# Patient Record
Sex: Female | Born: 1960 | Race: White | Hispanic: No | Marital: Married | Smoking: Never smoker
Health system: Southern US, Community
[De-identification: ages and names within clinical notes are randomized; demographics above are authoritative.]

## PROBLEM LIST (undated history)

## (undated) DIAGNOSIS — K219 Gastro-esophageal reflux disease without esophagitis: Secondary | ICD-10-CM

## (undated) DIAGNOSIS — I10 Essential (primary) hypertension: Secondary | ICD-10-CM

## (undated) DIAGNOSIS — D219 Benign neoplasm of connective and other soft tissue, unspecified: Secondary | ICD-10-CM

## (undated) DIAGNOSIS — N852 Hypertrophy of uterus: Secondary | ICD-10-CM

## (undated) DIAGNOSIS — N83209 Unspecified ovarian cyst, unspecified side: Secondary | ICD-10-CM

## (undated) DIAGNOSIS — F419 Anxiety disorder, unspecified: Secondary | ICD-10-CM

## (undated) DIAGNOSIS — G4700 Insomnia, unspecified: Secondary | ICD-10-CM

## (undated) DIAGNOSIS — R609 Edema, unspecified: Secondary | ICD-10-CM

## (undated) DIAGNOSIS — B029 Zoster without complications: Secondary | ICD-10-CM

## (undated) HISTORY — DX: Benign neoplasm of connective and other soft tissue, unspecified: D21.9

## (undated) HISTORY — DX: Insomnia, unspecified: G47.00

## (undated) HISTORY — PX: TUBAL LIGATION: SHX77

## (undated) HISTORY — DX: Unspecified ovarian cyst, unspecified side: N83.209

## (undated) HISTORY — DX: Zoster without complications: B02.9

## (undated) HISTORY — DX: Gastro-esophageal reflux disease without esophagitis: K21.9

## (undated) HISTORY — DX: Anxiety disorder, unspecified: F41.9

## (undated) HISTORY — DX: Essential (primary) hypertension: I10

## (undated) HISTORY — DX: Hypertrophy of uterus: N85.2

## (undated) HISTORY — DX: Edema, unspecified: R60.9

---

## 2005-02-08 ENCOUNTER — Ambulatory Visit: Payer: Self-pay | Admitting: Obstetrics and Gynecology

## 2005-12-06 ENCOUNTER — Ambulatory Visit: Payer: Self-pay | Admitting: Gastroenterology

## 2006-02-27 ENCOUNTER — Ambulatory Visit: Payer: Self-pay | Admitting: Obstetrics and Gynecology

## 2007-03-05 ENCOUNTER — Ambulatory Visit: Payer: Self-pay | Admitting: Obstetrics and Gynecology

## 2008-03-08 ENCOUNTER — Ambulatory Visit: Payer: Self-pay | Admitting: Obstetrics and Gynecology

## 2009-05-18 ENCOUNTER — Ambulatory Visit: Payer: Self-pay | Admitting: Obstetrics and Gynecology

## 2010-07-20 ENCOUNTER — Ambulatory Visit: Payer: Self-pay | Admitting: Obstetrics and Gynecology

## 2011-08-08 ENCOUNTER — Ambulatory Visit: Payer: Self-pay | Admitting: Obstetrics and Gynecology

## 2012-08-12 ENCOUNTER — Ambulatory Visit: Payer: Self-pay | Admitting: Obstetrics and Gynecology

## 2013-05-25 LAB — HM COLONOSCOPY

## 2013-06-11 DIAGNOSIS — Z1211 Encounter for screening for malignant neoplasm of colon: Secondary | ICD-10-CM | POA: Insufficient documentation

## 2013-06-25 LAB — HM PAP SMEAR: HM Pap smear: NEGATIVE

## 2013-09-29 ENCOUNTER — Ambulatory Visit: Payer: Self-pay | Admitting: Obstetrics and Gynecology

## 2014-08-03 ENCOUNTER — Ambulatory Visit (INDEPENDENT_AMBULATORY_CARE_PROVIDER_SITE_OTHER): Payer: BC Managed Care – PPO | Admitting: Cardiovascular Disease

## 2014-08-03 ENCOUNTER — Encounter: Payer: Self-pay | Admitting: Cardiovascular Disease

## 2014-08-03 VITALS — BP 108/76 | HR 53 | Ht 63.0 in | Wt 131.5 lb

## 2014-08-03 DIAGNOSIS — R208 Other disturbances of skin sensation: Secondary | ICD-10-CM

## 2014-08-03 DIAGNOSIS — I4902 Ventricular flutter: Secondary | ICD-10-CM

## 2014-08-03 DIAGNOSIS — R0602 Shortness of breath: Secondary | ICD-10-CM | POA: Insufficient documentation

## 2014-08-03 DIAGNOSIS — R200 Anesthesia of skin: Secondary | ICD-10-CM | POA: Insufficient documentation

## 2014-08-03 DIAGNOSIS — I10 Essential (primary) hypertension: Secondary | ICD-10-CM

## 2014-08-03 NOTE — Assessment & Plan Note (Signed)
Blood pressure is controlled

## 2014-08-03 NOTE — Progress Notes (Signed)
   HPI  Abigail Gross is a pleasant 53 year old female who is here today for evaluation of exertional fatigue. She has no previous cardiac history. Other than mild hypertension, she has been completely healthy. She is a lifelong nonsmoker. I take care of her mother who has peripheral arterial disease. She noticed recent symptoms of exertional fatigue with shortness of breath but no chest discomfort. This has affected her ability to exercise which is unusual for her. She gets tired quickly after only 20 minutes. She also complains of numbness in both feet with exercise worse on the left side. There is no calf pain. She has occasional palpitations. She only drinks one cup of coffee a day.  Allergies  Allergen Reactions  . Sulfa Antibiotics Hives     No current outpatient prescriptions on file prior to visit.   No current facility-administered medications on file prior to visit.     Past Medical History  Diagnosis Date  . Hypertension      Past Surgical History  Procedure Laterality Date  . Tubal ligation       Family History  Problem Relation Age of Onset  . Hypertension Mother   . Hypertension Father      History   Social History  . Marital Status: Married    Spouse Name: N/A    Number of Children: N/A  . Years of Education: N/A   Occupational History  . Not on file.   Social History Main Topics  . Smoking status: Never Smoker   . Smokeless tobacco: Not on file  . Alcohol Use: Yes  . Drug Use: No  . Sexual Activity: Not on file   Other Topics Concern  . Not on file   Social History Narrative  . No narrative on file     ROS A 10 point review of system was performed. It is negative other than that mentioned in the history of present illness.   PHYSICAL EXAM   BP 108/76 mmHg  Pulse 53  Ht 5\' 3"  (1.6 m)  Wt 131 lb 8 oz (59.648 kg)  BMI 23.30 kg/m2 Constitutional: She is oriented to person, place, and time. She appears well-developed and well-nourished. No  distress.  HENT: No nasal discharge.  Head: Normocephalic and atraumatic.  Eyes: Pupils are equal and round. No discharge.  Neck: Normal range of motion. Neck supple. No JVD present. No thyromegaly present.  Cardiovascular: Normal rate, regular rhythm, normal heart sounds. Exam reveals no gallop and no friction rub. No murmur heard.  Pulmonary/Chest: Effort normal and breath sounds normal. No stridor. No respiratory distress. She has no wheezes. She has no rales. She exhibits no tenderness.  Abdominal: Soft. Bowel sounds are normal. She exhibits no distension. There is no tenderness. There is no rebound and no guarding.  Musculoskeletal: Normal range of motion. She exhibits no edema and no tenderness.  Neurological: She is alert and oriented to person, place, and time. Coordination normal.  Skin: Skin is warm and dry. No rash noted. She is not diaphoretic. No erythema. No pallor.  Psychiatric: She has a normal mood and affect. Her behavior is normal. Judgment and thought content normal.     ZOX:WRUEA  Bradycardia  WITHIN NORMAL LIMITS   ASSESSMENT AND PLAN

## 2014-08-03 NOTE — Assessment & Plan Note (Signed)
She had exertional fatigue and dyspnea of unclear etiology. She was told recently about possible old mono infection. She is concerned about possible cardiac etiology for her symptoms. Thus, I requested a stress echo for evaluation. Given that her symptoms consist mainly of fatigue and shortness of breath, that echocardiogram component will help insure no structural heart abnormalities.

## 2014-08-03 NOTE — Assessment & Plan Note (Signed)
This is likely neuropathic and not due to PAD. Distal pulses are normal bilaterally.

## 2014-08-03 NOTE — Patient Instructions (Addendum)
Your physician has requested that you have a stress echocardiogram. For further information please visit [email or web address]. Please follow instruction sheet as given. -you can eat a light meal  -take your medications  -wear comfortable cloths and walking shoes   Your physician recommends that you schedule a follow-up appointment in:  As needed   Your next appointment will be scheduled in our new office located at :  [facility] [facility]  [address], [place]  [place], [place] [address]

## 2014-08-24 ENCOUNTER — Other Ambulatory Visit (INDEPENDENT_AMBULATORY_CARE_PROVIDER_SITE_OTHER): Payer: BC Managed Care – PPO

## 2014-08-24 DIAGNOSIS — R0602 Shortness of breath: Secondary | ICD-10-CM

## 2014-08-26 NOTE — Progress Notes (Signed)
LVM @ 1203

## 2014-08-27 ENCOUNTER — Encounter: Payer: Self-pay | Admitting: Cardiovascular Disease

## 2014-09-30 ENCOUNTER — Ambulatory Visit: Payer: Self-pay | Admitting: Obstetrics and Gynecology

## 2014-09-30 LAB — HM MAMMOGRAPHY

## 2015-07-27 ENCOUNTER — Encounter: Payer: Self-pay | Admitting: Obstetrics and Gynecology

## 2015-09-08 ENCOUNTER — Encounter: Payer: Self-pay | Admitting: Obstetrics and Gynecology

## 2015-09-28 ENCOUNTER — Ambulatory Visit (INDEPENDENT_AMBULATORY_CARE_PROVIDER_SITE_OTHER): Payer: BLUE CROSS/BLUE SHIELD | Admitting: Obstetrics and Gynecology

## 2015-09-28 ENCOUNTER — Encounter: Payer: Self-pay | Admitting: Obstetrics and Gynecology

## 2015-09-28 VITALS — BP 115/69 | HR 62 | Ht 63.0 in | Wt 122.9 lb

## 2015-09-28 DIAGNOSIS — Z0000 Encounter for general adult medical examination without abnormal findings: Secondary | ICD-10-CM | POA: Diagnosis not present

## 2015-09-28 DIAGNOSIS — G4700 Insomnia, unspecified: Secondary | ICD-10-CM | POA: Insufficient documentation

## 2015-09-28 DIAGNOSIS — Z1211 Encounter for screening for malignant neoplasm of colon: Secondary | ICD-10-CM

## 2015-09-28 DIAGNOSIS — Z1239 Encounter for other screening for malignant neoplasm of breast: Secondary | ICD-10-CM

## 2015-09-28 DIAGNOSIS — N951 Menopausal and female climacteric states: Secondary | ICD-10-CM

## 2015-09-28 DIAGNOSIS — F988 Other specified behavioral and emotional disorders with onset usually occurring in childhood and adolescence: Secondary | ICD-10-CM | POA: Insufficient documentation

## 2015-09-28 DIAGNOSIS — Z01419 Encounter for gynecological examination (general) (routine) without abnormal findings: Secondary | ICD-10-CM

## 2015-09-28 NOTE — Progress Notes (Signed)
Patient ID: Abigail Gross, female   DOB: [birth year], 55 y.o.   MRN: [id number] ANNUAL PREVENTATIVE CARE GYN  ENCOUNTER NOTE  Subjective:       Abigail Gross is a 55 y.o. No obstetric history on file. female here for a routine annual gynecologic exam.  Current complaints: 1. Hot flahes- discuss menopause sx  Patient missed a cycle 2 months ago.  She has starting having hot flashes.  She does not have any vaginal dryness, symptomatology.   Gynecologic History Patient's last menstrual period was 07/26/2015 (approximate). Contraception: tubal ligation Last Pap: 06/2013 neg/neg. Results were: normal Last mammogram: 09/2014. Results were: normal  Obstetric History OB History  No data available    Past Medical History  Diagnosis Date  . Hypertension   . GERD (gastroesophageal reflux disease)   . Anxiety   . Insomnia   . Ovarian cyst   . Fibroid   . Enlarged uterus   . Edema     Past Surgical History  Procedure Laterality Date  . Tubal ligation      Current Outpatient Prescriptions on File Prior to Visit  Medication Sig Dispense Refill  . cetirizine (ZYRTEC) 10 MG tablet Take 10 mg by mouth as needed.     Marland Kitchen esomeprazole (NEXIUM) 40 MG capsule Take 40 mg by mouth daily.     Marland Kitchen triamterene-hydrochlorothiazide (MAXZIDE-25) 37.5-25 MG per tablet Take 1 tablet by mouth daily.     . valACYclovir (VALTREX) 1000 MG tablet 1,000 mg as needed.   2  . zolpidem (AMBIEN) 10 MG tablet as needed.   3   No current facility-administered medications on file prior to visit.    Allergies  Allergen Reactions  . Sulfa Antibiotics Hives  . Albuterol Palpitations    Social History   Social History  . Marital Status: Married    Spouse Name: N/A  . Number of Children: N/A  . Years of Education: N/A   Occupational History  . Not on file.   Social History Main Topics  . Smoking status: Never Smoker   . Smokeless tobacco: Not on file  . Alcohol Use: 0.6 oz/week    1 Glasses of wine  per week     Comment: qd  . Drug Use: No  . Sexual Activity: Yes    Birth Control/ Protection: Surgical   Other Topics Concern  . Not on file   Social History Narrative    Family History  Problem Relation Age of Onset  . Hypertension Mother   . Hypertension Father   . Breast cancer Maternal Aunt   . Colon cancer Paternal Grandmother   . Diabetes Paternal Grandfather   . Heart disease Neg Hx   . Ovarian cancer Neg Hx     The following portions of the patient's history were reviewed and updated as appropriate: allergies, current medications, past family history, past medical history, past social history, past surgical history and problem list.  Review of Systems ROS Review of Systems - General ROS: negative for - chills, fatigue, fever, hot flashes, night sweats, weight gain or weight loss Psychological ROS: negative for - anxiety, decreased libido, depression, mood swings, physical abuse or sexual abuse Ophthalmic ROS: negative for - blurry vision, eye pain or loss of vision ENT ROS: negative for - headaches, hearing change, visual changes or vocal changes Allergy and Immunology ROS: negative for - hives, itchy/watery eyes or seasonal allergies Hematological and Lymphatic ROS: negative for - bleeding problems, bruising, swollen lymph nodes or  weight loss Endocrine ROS: negative for - galactorrhea, hair pattern changes,  malaise/lethargy, mood swings, palpitations, polydipsia/polyuria, skin changes, temperature intolerance or unexpected weight changes.  POSITIVE-hot flashes, Breast ROS: negative for - new or changing breast lumps or nipple discharge Respiratory ROS: negative for - cough or shortness of breath Cardiovascular ROS: negative for - chest pain, irregular heartbeat, palpitations or shortness of breath Gastrointestinal ROS: no abdominal pain, change in bowel habits, or black or bloody stools Genito-Urinary ROS: no dysuria, trouble voiding, or hematuria Musculoskeletal  ROS: negative for - joint pain or joint stiffness Neurological ROS: negative for - bowel and bladder control changes Dermatological ROS: negative for rash and skin lesion changes   Objective:   BP 115/69 mmHg  Pulse 62  Ht 5\' 3"  (1.6 m)  Wt 122 lb 14.4 oz (55.747 kg)  BMI 21.78 kg/m2  LMP 07/26/2015 (Approximate) CONSTITUTIONAL: Well-developed, well-nourished female in no acute distress.  PSYCHIATRIC: Normal mood and affect. Normal behavior. Normal judgment and thought content. [place]: Alert and oriented to person, place, and time. Normal muscle tone coordination. No cranial nerve deficit noted. HENT:  Normocephalic, atraumatic, External right and left ear normal. Oropharynx is clear and moist EYES: Conjunctivae and EOM are normal. Pupils are equal, round, and reactive to light. No scleral icterus.  NECK: Normal range of motion, supple, no masses.  Normal thyroid.  SKIN: Skin is warm and dry. No rash noted. Not diaphoretic. No erythema. No pallor. CARDIOVASCULAR: Normal heart rate noted, regular rhythm, no murmur. RESPIRATORY: Clear to auscultation bilaterally. Effort and breath sounds normal, no problems with respiration noted. BREASTS: Symmetric in size. No masses, skin changes, nipple drainage, or lymphadenopathy. ABDOMEN: Soft, normal bowel sounds, no distention noted.  No tenderness, rebound or guarding.  BLADDER: Normal PELVIC:  External Genitalia: Normal  BUS: Normal  Vagina: Normal; minimal atrophic changes  Cervix: Normal  Uterus: Normal; anteverted, top normal size (decreased from last year-10 weeks size)  Adnexa: Normal  RV: External Exam NormaI, No Rectal Masses and Normal Sphincter tone  MUSCULOSKELETAL: Normal range of motion. No tenderness.  No cyanosis, clubbing, or edema.  2+ distal pulses. LYMPHATIC: No Axillary, Supraclavicular, or Inguinal Adenopathy.    Assessment:   Annual gynecologic examination 55 y.o. Contraception: tubal ligation bmi-  21.78 climacteric  Plan:  Pap: Not needed Mammogram: Ordered Stool Guaiac Testing:  declined Labs: discuss with mad Routine preventative health maintenance measures emphasized: Exercise/Diet/Weight control, Tobacco Warnings, Alcohol/Substance use risks and Stress Management Menstrual calendar, monitoring Return to [place], [place] Brayton Mars, MD  Note: This dictation was prepared with Dragon dictation along with smaller phrase technology. Any transcriptional errors that result from this process are unintentional.

## 2015-09-28 NOTE — Patient Instructions (Signed)
1.  Pap/HPV testing is done today. 2.  Mammogram is scheduled. 3.  Stool guaiac cards are given for colon cancer screening. 4.  Continue with healthy eating and exercise. 5.  Monitor menstrual cycles.  Once you skip 12 months of cycles, you are officially in menopause. 6.  Return in 1 year for annual exam.

## 2015-10-01 LAB — PAP IG AND HPV HIGH-RISK
HPV, high-risk: NEGATIVE
PAP SMEAR COMMENT: 0

## 2015-10-06 ENCOUNTER — Ambulatory Visit
Admission: RE | Admit: 2015-10-06 | Discharge: 2015-10-06 | Disposition: A | Discharge status: Home / Self Care | Payer: BLUE CROSS/BLUE SHIELD | Source: Ambulatory Visit | Attending: Obstetrics and Gynecology | Admitting: Obstetrics and Gynecology

## 2015-10-06 DIAGNOSIS — Z1231 Encounter for screening mammogram for malignant neoplasm of breast: Secondary | ICD-10-CM | POA: Diagnosis present

## 2015-10-06 DIAGNOSIS — Z1239 Encounter for other screening for malignant neoplasm of breast: Secondary | ICD-10-CM

## 2016-10-01 NOTE — Progress Notes (Signed)
Patient ID: Abigail Gross, female   DOB: 10-09-1960, 56 y.o.   MRN: [id number] ANNUAL PREVENTATIVE CARE GYN  ENCOUNTER NOTE  Subjective:       Abigail Gross is a 56 y.o. G2 P72 female here for a routine annual gynecologic exam.  Current complaints:  1. Back pain - rt  Patient is still having regular monthly cycles; no significant hot flashes. Bleeding lasts approximate 7 days. Bowel function is normal Bladder function is normal. Patient is having some right flank tenderness; she does have a chronic cystic mass in right lower quadrant thought to be bowel; this has not been assessed with ultrasound in several years. Patient is complaining of some shortness of breath following an upper respiratory infection which was treated with prednisone and Zithromax. Intermittent symptoms are ongoing with occasional cough up clear phlegm    Gynecologic History LMP- 08/24/2016 Contraception: tubal ligation Last Pap: 09/2015 neg/neg. Results were: normal Last mammogram: 09/2015 birad 1. Results were: normal  Obstetric History OB History  No data available    Past Medical History:  Diagnosis Date  . Anxiety   . Edema   . Enlarged uterus   . Fibroid   . GERD (gastroesophageal reflux disease)   . Hypertension   . Insomnia   . Ovarian cyst     Past Surgical History:  Procedure Laterality Date  . TUBAL LIGATION      Current Outpatient Prescriptions on File Prior to Visit  Medication Sig Dispense Refill  . cetirizine (ZYRTEC) 10 MG tablet Take 10 mg by mouth as needed.     Marland Kitchen EPINEPHrine Bitartrate (ASTHMAHALER IN) Inhale into the lungs.    Marland Kitchen esomeprazole (NEXIUM) 40 MG capsule Take 40 mg by mouth daily.     . fluticasone (FLONASE) 50 MCG/ACT nasal spray PLACE 2 SPRAYS INTO BOTH NOSTRILS ONCE DAILY.    Marland Kitchen triamterene-hydrochlorothiazide (MAXZIDE-25) 37.5-25 MG per tablet Take 1 tablet by mouth daily.     . valACYclovir (VALTREX) 1000 MG tablet 1,000 mg as needed.   2  . zolpidem (AMBIEN)  10 MG tablet as needed.   3   No current facility-administered medications on file prior to visit.     Allergies  Allergen Reactions  . Sulfa Antibiotics Hives  . Albuterol Palpitations    Social History   Social History  . Marital status: Married    Spouse name: N/A  . Number of children: N/A  . Years of education: N/A   Occupational History  . Not on file.   Social History Main Topics  . Smoking status: Never Smoker  . Smokeless tobacco: Not on file  . Alcohol use 0.6 oz/week    1 Glasses of wine per week     Comment: qd  . Drug use: No  . Sexual activity: Yes    Birth control/ protection: Surgical   Other Topics Concern  . Not on file   Social History Narrative  . No narrative on file    Family History  Problem Relation Age of Onset  . Hypertension Mother   . Hypertension Father   . Breast cancer Maternal Aunt 52  . Colon cancer Paternal Grandmother   . Diabetes Paternal Grandfather   . Heart disease Neg Hx   . Ovarian cancer Neg Hx     The following portions of the patient's history were reviewed and updated as appropriate: allergies, current medications, past family history, past medical history, past social history, past surgical history and problem list.  Review of Systems ROS Review of Systems - General ROS: negative for - chills, fatigue, fever, hot flashes, night sweats, weight gain or weight loss Psychological ROS: negative for - anxiety, decreased libido, depression, mood swings, physical abuse or sexual abuse Ophthalmic ROS: negative for - blurry vision, eye pain or loss of vision ENT ROS: negative for - headaches, hearing change, visual changes or vocal changes Allergy and Immunology ROS: negative for - hives, itchy/watery eyes or seasonal allergies Hematological and Lymphatic ROS: negative for - bleeding problems, bruising, swollen lymph nodes or weight loss Endocrine ROS: negative for - galactorrhea, hair pattern changes,  malaise/lethargy,  mood swings, palpitations, polydipsia/polyuria, skin changes, temperature intolerance or unexpected weight changes.  POSITIVE-hot flashes, Breast ROS: negative for - new or changing breast lumps or nipple discharge Respiratory ROS: negative for - cough or shortness of breath Cardiovascular ROS: negative for - chest pain, irregular heartbeat, palpitations or shortness of breath Gastrointestinal ROS: no abdominal pain, change in bowel habits, or black or bloody stools Genito-Urinary ROS: no dysuria, trouble voiding, or hematuria Musculoskeletal ROS: negative for - joint pain or joint stiffness Neurological ROS: negative for - bowel and bladder control changes Dermatological ROS: negative for rash and skin lesion changes   Objective:   BP 128/68   Pulse 60   Ht 5\' 3"  (1.6 m)   Wt 122 lb 14.4 oz (55.7 kg)   BMI 21.77 kg/m   CONSTITUTIONAL: Well-developed, well-nourished female in no acute distress.  PSYCHIATRIC: Normal mood and affect. Normal behavior. Normal judgment and thought content. [place]: Alert and oriented to person, place, and time. Normal muscle tone coordination. No cranial nerve deficit noted. HENT:  Normocephalic, atraumatic, External right and left ear normal. Oropharynx is clear and moist EYES: Conjunctivae and EOM are normal. Pupils are equal, round, and reactive to light. No scleral icterus.  NECK: Normal range of motion, supple, no masses.  Normal thyroid.  SKIN: Skin is warm and dry. No rash noted. Not diaphoretic. No erythema. No pallor. CARDIOVASCULAR: Normal heart rate noted, regular rhythm, no murmur. RESPIRATORY: Clear to auscultation bilaterally. Effort and breath sounds normal, no problems with respiration noted.No wheezes. No rhonchi. No tachypnea BREASTS: Symmetric in size. No masses, skin changes, nipple drainage, or lymphadenopathy. ABDOMEN: Soft, normal bowel sounds, no distention noted.  No tenderness, rebound or guarding.  BLADDER:  Normal PELVIC:  External Genitalia: Normal  BUS: Normal  Vagina: Normal; minimal atrophic changes  Cervix: Normal  Uterus: Normal; anteverted, top normal size  Adnexa: Palpable cystic fullness approximately 4 cm in right lower quadrant, 1/4 tender; left adnexa nonpalpable and nontender  RV: External Exam NormaI, No Rectal Masses and Normal Sphincter tone  MUSCULOSKELETAL: Normal range of motion. No tenderness.  No cyanosis, clubbing, or edema.  2+ distal pulses. LYMPHATIC: No Axillary, Supraclavicular, or Inguinal Adenopathy.    Assessment:   Annual gynecologic examination 56 y.o. 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 Any transcriptional errors that  result from this process are unintentional.

## 2016-10-03 ENCOUNTER — Encounter: Payer: Self-pay | Admitting: Obstetrics and Gynecology

## 2016-10-03 ENCOUNTER — Ambulatory Visit (INDEPENDENT_AMBULATORY_CARE_PROVIDER_SITE_OTHER): Payer: BLUE CROSS/BLUE SHIELD | Admitting: Obstetrics and Gynecology

## 2016-10-03 VITALS — BP 128/68 | HR 60 | Ht 63.0 in | Wt 122.9 lb

## 2016-10-03 DIAGNOSIS — Z1239 Encounter for other screening for malignant neoplasm of breast: Secondary | ICD-10-CM

## 2016-10-03 DIAGNOSIS — Z01419 Encounter for gynecological examination (general) (routine) without abnormal findings: Secondary | ICD-10-CM | POA: Diagnosis not present

## 2016-10-03 DIAGNOSIS — Z1211 Encounter for screening for malignant neoplasm of colon: Secondary | ICD-10-CM

## 2016-10-03 DIAGNOSIS — R05 Cough: Secondary | ICD-10-CM

## 2016-10-03 DIAGNOSIS — Z1231 Encounter for screening mammogram for malignant neoplasm of breast: Secondary | ICD-10-CM

## 2016-10-03 DIAGNOSIS — R059 Cough, unspecified: Secondary | ICD-10-CM

## 2016-10-03 DIAGNOSIS — R1031 Right lower quadrant pain: Secondary | ICD-10-CM | POA: Diagnosis not present

## 2016-10-03 MED ORDER — ALBUTEROL SULFATE HFA 108 (90 BASE) MCG/ACT IN AERS: 2.0000 | INHALATION_SPRAY | Freq: Four times a day (QID) | RESPIRATORY_TRACT | 2 refills | Status: DC | PRN

## 2016-10-03 NOTE — Patient Instructions (Signed)
1. No Pap smear done 2. Mammogram ordered 3. Stool guaiac cards are given for colon cancer screening 4. Albuterol MDI inhaler is ordered for postinfectious reactive airway disease 5. Ultrasound is scheduled to assess right lower quadrant tenderness and palpable cystic mass (chronic) 6. Continue with healthy eating and exercise 7. Return in 1 year  Health Maintenance, Female Introduction Adopting a healthy lifestyle and getting preventive care can go a long way to promote health and wellness. Talk with your health care provider about what schedule of regular examinations is right for you. This is a good chance for you to check in with your provider about disease prevention and staying healthy. In between checkups, there are plenty of things you can do on your own. Experts have done a lot of research about which lifestyle changes and preventive measures are most likely to keep you healthy. Ask your health care provider for more information. Weight and diet Eat a healthy diet  Be sure to include plenty of vegetables, fruits, low-fat dairy products, and lean protein.  Do not eat a lot of foods high in solid fats, added sugars, or salt.  Get regular exercise. This is one of the most important things you can do for your health.  Most adults should exercise for at least 150 minutes each week. The exercise should increase your heart rate and make you sweat (moderate-intensity exercise).  Most adults should also do strengthening exercises at least twice a week. This is in addition to the moderate-intensity exercise. Maintain a healthy weight  Body mass index (BMI) is a measurement that can be used to identify possible weight problems. It estimates body fat based on height and weight. Your health care provider can help determine your BMI and help you achieve or maintain a healthy weight.  For females 15 years of age and older:  A BMI below 18.5 is considered underweight.  A BMI of 18.5 to 24.9  is normal.  A BMI of 25 to 29.9 is considered overweight.  A BMI of 30 and above is considered obese. Watch levels of cholesterol and blood lipids  You should start having your blood tested for lipids and cholesterol at 56 years of age, then have this test every 5 years.  You may need to have your cholesterol levels checked more often if:  Your lipid or cholesterol levels are high.  You are older than 56 years of age.  You are at high risk for heart disease. Cancer screening Lung Cancer  Lung cancer screening is recommended for adults 17-86 years old who are at high risk for lung cancer because of a history of smoking.  A yearly low-dose CT scan of the lungs is recommended for people who:  Currently smoke.  Have quit within the past 15 years.  Have at least a 30-pack-year history of smoking. A pack year is smoking an average of one pack of cigarettes a day for 1 year.  Yearly screening should continue until it has been 15 years since you quit.  Yearly screening should stop if you develop a health problem that would prevent you from having lung cancer treatment. Breast Cancer  Practice breast self-awareness. This means understanding how your breasts normally appear and feel.  It also means doing regular breast self-exams. Let your health care provider know about any changes, no matter how small.  If you are in your 20s or 30s, you should have a clinical breast exam (CBE) by a health care provider every 1-3 years  as part of a regular health exam.  If you are 40 or older, have a CBE every year. Also consider having a breast X-ray (mammogram) every year.  If you have a family history of breast cancer, talk to your health care provider about genetic screening.  If you are at high risk for breast cancer, talk to your health care provider about having an MRI and a mammogram every year.  Breast cancer gene (BRCA) assessment is recommended for women who have family members with  BRCA-related cancers. BRCA-related cancers include:  Breast.  Ovarian.  Tubal.  Peritoneal cancers.  Results of the assessment will determine the need for genetic counseling and BRCA1 and BRCA2 testing. Cervical Cancer  Your health care provider may recommend that you be screened regularly for cancer of the pelvic organs (ovaries, uterus, and vagina). This screening involves a pelvic examination, including checking for microscopic changes to the surface of your cervix (Pap test). You may be encouraged to have this screening done every 3 years, beginning at age 21.  For women ages 30-65, health care providers may recommend pelvic exams and Pap testing every 3 years, or they may recommend the Pap and pelvic exam, combined with testing for human papilloma virus (HPV), every 5 years. Some types of HPV increase your risk of cervical cancer. Testing for HPV may also be done on women of any age with unclear Pap test results.  Other health care providers may not recommend any screening for nonpregnant women who are considered low risk for pelvic cancer and who do not have symptoms. Ask your health care provider if a screening pelvic exam is right for you.  If you have had past treatment for cervical cancer or a condition that could lead to cancer, you need Pap tests and screening for cancer for at least 20 years after your treatment. If Pap tests have been discontinued, your risk factors (such as having a new sexual partner) need to be reassessed to determine if screening should resume. Some women have medical problems that increase the chance of getting cervical cancer. In these cases, your health care provider may recommend more frequent screening and Pap tests. Colorectal Cancer  This type of cancer can be detected and often prevented.  Routine colorectal cancer screening usually begins at 56 years of age and continues through 56 years of age.  Your health care provider may recommend screening at  an earlier age if you have risk factors for colon cancer.  Your health care provider may also recommend using home test kits to check for hidden blood in the stool.  A small camera at the end of a tube can be used to examine your colon directly (sigmoidoscopy or colonoscopy). This is done to check for the earliest forms of colorectal cancer.  Routine screening usually begins at age 50.  Direct examination of the colon should be repeated every 5-10 years through 56 years of age. However, you may need to be screened more often if early forms of precancerous polyps or small growths are found. Skin Cancer  Check your skin from head to toe regularly.  Tell your health care provider about any new moles or changes in moles, especially if there is a change in a mole's shape or color.  Also tell your health care provider if you have a mole that is larger than the size of a pencil eraser.  Always use sunscreen. Apply sunscreen liberally and repeatedly throughout the day.  Protect yourself by wearing long   sleeves, pants, a wide-brimmed hat, and sunglasses whenever you are outside. Heart disease, diabetes, and high blood pressure  High blood pressure causes heart disease and increases the risk of stroke. High blood pressure is more likely to develop in:  People who have blood pressure in the high end of the normal range (130-139/85-89 mm Hg).  People who are overweight or obese.  People who are African American.  If you are 48-73 years of age, have your blood pressure checked every 3-5 years. If you are 62 years of age or older, have your blood pressure checked every year. You should have your blood pressure measured twice-once when you are at a hospital or clinic, and once when you are not at a hospital or clinic. Record the average of the two measurements. To check your blood pressure when you are not at a hospital or clinic, you can use:  An automated blood pressure machine at a pharmacy.  A  home blood pressure monitor.  If you are between 31 years and 77 years old, ask your health care provider if you should take aspirin to prevent strokes.  Have regular diabetes screenings. This involves taking a blood sample to check your fasting blood sugar level.  If you are at a normal weight and have a low risk for diabetes, have this test once every three years after 56 years of age.  If you are overweight and have a high risk for diabetes, consider being tested at a younger age or more often. Preventing infection Hepatitis B  If you have a higher risk for hepatitis B, you should be screened for this virus. You are considered at high risk for hepatitis B if:  You were born in a country where hepatitis B is common. Ask your health care provider which countries are considered high risk.  Your parents were born in a high-risk country, and you have not been immunized against hepatitis B (hepatitis B vaccine).  You have HIV or AIDS.  You use needles to inject street drugs.  You live with someone who has hepatitis B.  You have had sex with someone who has hepatitis B.  You get hemodialysis treatment.  You take certain medicines for conditions, including cancer, organ transplantation, and autoimmune conditions. Hepatitis C  Blood testing is recommended for:  Everyone born from [birth year] through 1965.  Anyone with known risk factors for hepatitis C. Sexually transmitted infections (STIs)  You should be screened for sexually transmitted infections (STIs) including gonorrhea and chlamydia if:  You are sexually active and are younger than 56 years of age.  You are older than 56 years of age and your health care provider tells you that you are at risk for this type of infection.  Your sexual activity has changed since you were last screened and you are at an increased risk for chlamydia or gonorrhea. Ask your health care provider if you are at risk.  If you do not have HIV, but are  at risk, it may be recommended that you take a prescription medicine daily to prevent HIV infection. This is called pre-exposure prophylaxis (PrEP). You are considered at risk if:  You are sexually active and do not regularly use condoms or know the HIV status of your partner(s).  You take drugs by injection.  You are sexually active with a partner who has HIV. Talk with your health care provider about whether you are at high risk of being infected with HIV. If you choose to begin  PrEP, you should first be tested for HIV. You should then be tested every 3 months for as long as you are taking PrEP. Pregnancy  If you are premenopausal and you may become pregnant, ask your health care provider about preconception counseling.  If you may become pregnant, take 400 to 800 micrograms (mcg) of folic acid every day.  If you want to prevent pregnancy, talk to your health care provider about birth control (contraception). Osteoporosis and menopause  Osteoporosis is a disease in which the bones lose minerals and strength with aging. This can result in serious bone fractures. Your risk for osteoporosis can be identified using a bone density scan.  If you are 38 years of age or older, or if you are at risk for osteoporosis and fractures, ask your health care provider if you should be screened.  Ask your health care provider whether you should take a calcium or vitamin D supplement to lower your risk for osteoporosis.  Menopause may have certain physical symptoms and risks.  Hormone replacement therapy may reduce some of these symptoms and risks. Talk to your health care provider about whether hormone replacement therapy is right for you. Follow these instructions at home:  Schedule regular health, dental, and eye exams.  Stay current with your immunizations.  Do not use any tobacco products including cigarettes, chewing tobacco, or electronic cigarettes.  If you are pregnant, do not drink  alcohol.  If you are breastfeeding, limit how much and how often you drink alcohol.  Limit alcohol intake to no more than 1 drink per day for nonpregnant women. One drink equals 12 ounces of beer, 5 ounces of wine, or 1 ounces of hard liquor.  Do not use street drugs.  Do not share needles.  Ask your health care provider for help if you need support or information about quitting drugs.  Tell your health care provider if you often feel depressed.  Tell your health care provider if you have ever been abused or do not feel safe at home. This information is not intended to replace advice given to you by your health care provider. Make sure you discuss any questions you have with your health care provider. Document Released: 03/26/2011 Document Revised: 02/16/2016 Document Reviewed: 06/14/2015  2017 Elsevier

## 2017-05-30 ENCOUNTER — Ambulatory Visit
Admission: RE | Admit: 2017-05-30 | Discharge: 2017-05-30 | Disposition: A | Discharge status: Home / Self Care | Payer: BLUE CROSS/BLUE SHIELD | Source: Ambulatory Visit | Attending: Obstetrics and Gynecology | Admitting: Obstetrics and Gynecology

## 2017-05-30 DIAGNOSIS — Z1231 Encounter for screening mammogram for malignant neoplasm of breast: Secondary | ICD-10-CM | POA: Diagnosis present

## 2017-05-30 DIAGNOSIS — Z1239 Encounter for other screening for malignant neoplasm of breast: Secondary | ICD-10-CM

## 2017-10-10 ENCOUNTER — Other Ambulatory Visit: Payer: Self-pay | Admitting: Obstetrics and Gynecology

## 2018-02-20 ENCOUNTER — Other Ambulatory Visit: Payer: Self-pay | Admitting: Obstetrics and Gynecology

## 2018-02-20 DIAGNOSIS — Z1231 Encounter for screening mammogram for malignant neoplasm of breast: Secondary | ICD-10-CM

## 2018-06-03 ENCOUNTER — Ambulatory Visit
Admission: RE | Admit: 2018-06-03 | Discharge: 2018-06-03 | Disposition: A | Discharge status: Home / Self Care | Payer: BLUE CROSS/BLUE SHIELD | Source: Ambulatory Visit | Attending: Obstetrics and Gynecology | Admitting: Obstetrics and Gynecology

## 2018-06-03 DIAGNOSIS — Z1231 Encounter for screening mammogram for malignant neoplasm of breast: Secondary | ICD-10-CM | POA: Diagnosis not present

## 2018-06-30 DIAGNOSIS — M67441 Ganglion, right hand: Secondary | ICD-10-CM | POA: Insufficient documentation

## 2018-08-18 ENCOUNTER — Other Ambulatory Visit: Payer: Self-pay | Admitting: Physician Assistant

## 2018-08-18 DIAGNOSIS — H90A22 Sensorineural hearing loss, unilateral, left ear, with restricted hearing on the contralateral side: Secondary | ICD-10-CM

## 2018-09-10 ENCOUNTER — Ambulatory Visit: Payer: BLUE CROSS/BLUE SHIELD

## 2019-04-06 DIAGNOSIS — J9809 Other diseases of bronchus, not elsewhere classified: Secondary | ICD-10-CM | POA: Insufficient documentation

## 2019-04-06 DIAGNOSIS — K219 Gastro-esophageal reflux disease without esophagitis: Secondary | ICD-10-CM | POA: Insufficient documentation

## 2019-04-07 ENCOUNTER — Encounter: Payer: Self-pay | Admitting: Podiatry

## 2019-04-07 ENCOUNTER — Other Ambulatory Visit: Payer: Self-pay | Admitting: Podiatry

## 2019-04-07 ENCOUNTER — Other Ambulatory Visit: Payer: Self-pay

## 2019-04-07 ENCOUNTER — Ambulatory Visit (INDEPENDENT_AMBULATORY_CARE_PROVIDER_SITE_OTHER): Payer: BC Managed Care – PPO

## 2019-04-07 ENCOUNTER — Ambulatory Visit: Payer: BC Managed Care – PPO | Admitting: Podiatry

## 2019-04-07 VITALS — Temp 98.4°F

## 2019-04-07 DIAGNOSIS — S92505A Nondisplaced unspecified fracture of left lesser toe(s), initial encounter for closed fracture: Secondary | ICD-10-CM

## 2019-04-07 DIAGNOSIS — M722 Plantar fascial fibromatosis: Secondary | ICD-10-CM

## 2019-04-09 NOTE — Progress Notes (Signed)
   HPI: 58 y.o. female presenting today as a new patient with a chief complaint of pain to the left fourth toe that began 3-4 weeks ago secondary to an injury. She states she hit the toes on a toy and is still experiencing pain and swelling. Walking increases the pain. She has buddy-taped the toes together and has been elevating them for treatment. Patient is here for further evaluation and treatment.   Past Medical History:  Diagnosis Date  . Anxiety   . Edema   . Enlarged uterus   . Fibroid   . GERD (gastroesophageal reflux disease)   . Hypertension   . Insomnia   . Ovarian cyst   . Shingles      Physical Exam: General: The patient is alert and oriented x3 in no acute distress.  Dermatology: Skin is warm, dry and supple bilateral lower extremities. Negative for open lesions or macerations.  Vascular: Palpable pedal pulses bilaterally. No edema or erythema noted. Capillary refill within normal limits.  Neurological: Epicritic and protective threshold grossly intact bilaterally.   Musculoskeletal Exam: Pain with palpation noted to the left fourth toe. Range of motion within normal limits to all pedal and ankle joints bilateral. Muscle strength 5/5 in all groups bilateral.   Radiographic Exam:  Closed, nondisplaced fracture of the proximal phalanx fourth digit left foot.   Assessment: 1. Fracture proximal phalanx 4th toe left, closed, nondisplaced - initial encounter   Plan of Care:  1. Patient evaluated. X-Rays reviewed.  2. Recommended stiff sole shoes.  3. Recommended OTC Motrin as needed.  4. Explained that the toe may swell and be achy for a few months.  5. Return to clinic as needed.       Edrick Kins, DPM [facility]  Dr. Edrick Kins, DPM    [address], [place] [address]                Office [phone]  Fax [phone]

## 2019-07-28 ENCOUNTER — Telehealth: Payer: Self-pay

## 2019-07-28 DIAGNOSIS — Z1231 Encounter for screening mammogram for malignant neoplasm of breast: Secondary | ICD-10-CM

## 2019-07-28 NOTE — Telephone Encounter (Signed)
Please review previous message and contact patient about placing an order for a mammogram.

## 2019-07-28 NOTE — Telephone Encounter (Signed)
Pt requesting mammogram order for her at [facility]. Pt last seen 09/2016. Pt has physical appt scheduled with Dr. Marcelline Mates 08/26/19. Pt would like to go ahead and have mammogram sooner before appt. Please call pt.

## 2019-07-30 NOTE — Addendum Note (Signed)
Addended by: Edwyna Shell on: 07/30/2019 12:06 PM   Modules accepted: Orders

## 2019-07-30 NOTE — Telephone Encounter (Signed)
Pt called no answer LM via VM informing pt that her mammogram order has been placed.

## 2019-08-26 ENCOUNTER — Encounter: Payer: BLUE CROSS/BLUE SHIELD | Admitting: Obstetrics and Gynecology

## 2019-10-29 ENCOUNTER — Ambulatory Visit: Payer: Self-pay | Admitting: Gastroenterology

## 2019-11-09 ENCOUNTER — Ambulatory Visit
Admission: RE | Admit: 2019-11-09 | Discharge: 2019-11-09 | Disposition: A | Discharge status: Home / Self Care | Payer: BC Managed Care – PPO | Source: Ambulatory Visit | Attending: Obstetrics and Gynecology | Admitting: Obstetrics and Gynecology

## 2019-11-09 DIAGNOSIS — Z1231 Encounter for screening mammogram for malignant neoplasm of breast: Secondary | ICD-10-CM | POA: Diagnosis present

## 2019-11-27 ENCOUNTER — Encounter: Payer: Self-pay | Admitting: Obstetrics and Gynecology

## 2019-12-17 ENCOUNTER — Ambulatory Visit: Payer: BC Managed Care – PPO | Attending: Internal Medicine

## 2019-12-17 DIAGNOSIS — Z23 Encounter for immunization: Secondary | ICD-10-CM

## 2019-12-17 NOTE — Progress Notes (Signed)
   Covid-19 Vaccination Clinic  Name:  Abigail Gross    MRN: [id number] DOB: April 30, 1961  12/17/2019  [name] was observed post Covid-19 immunization for 30 minutes based on pre-vaccination screening without incident. She was provided with Vaccine Information Sheet and instruction to access the V-Safe system.   [name] was instructed to call 911 with any severe reactions post vaccine: Marland Kitchen Difficulty breathing  . Swelling of face and throat  . A fast heartbeat  . A bad rash all over body  . Dizziness and weakness   Immunizations Administered    Name Date Dose VIS Date Route   Pfizer COVID-19 Vaccine 12/17/2019 12:35 PM 0.3 mL 09/04/2019 Intramuscular   Manufacturer: [place]   Lot: [id number]   [place]: [id number]

## 2020-01-11 ENCOUNTER — Ambulatory Visit: Payer: BC Managed Care – PPO | Attending: Internal Medicine

## 2020-01-11 DIAGNOSIS — Z23 Encounter for immunization: Secondary | ICD-10-CM

## 2020-01-11 NOTE — Progress Notes (Signed)
   Covid-19 Vaccination Clinic  Name:  Abigail Gross    MRN: [id number] DOB: June 07, 1961  01/11/2020  [name] was observed post Covid-19 immunization for 15 minutes without incident. She was provided with Vaccine Information Sheet and instruction to access the V-Safe system.   [name] was instructed to call 911 with any severe reactions post vaccine: Marland Kitchen Difficulty breathing  . Swelling of face and throat  . A fast heartbeat  . A bad rash all over body  . Dizziness and weakness   Immunizations Administered    Name Date Dose VIS Date Route   Pfizer COVID-19 Vaccine 01/11/2020 12:49 PM 0.3 mL 11/18/2018 Intramuscular   Manufacturer: [place]   Lot: [id number]   [place]: [id number]

## 2020-04-06 ENCOUNTER — Other Ambulatory Visit: Payer: Self-pay

## 2020-04-06 ENCOUNTER — Ambulatory Visit (INDEPENDENT_AMBULATORY_CARE_PROVIDER_SITE_OTHER): Payer: BC Managed Care – PPO | Admitting: Podiatry

## 2020-04-06 ENCOUNTER — Encounter: Payer: Self-pay | Admitting: Podiatry

## 2020-04-06 ENCOUNTER — Ambulatory Visit (INDEPENDENT_AMBULATORY_CARE_PROVIDER_SITE_OTHER): Payer: BC Managed Care – PPO

## 2020-04-06 DIAGNOSIS — M2041 Other hammer toe(s) (acquired), right foot: Secondary | ICD-10-CM

## 2020-04-06 NOTE — Progress Notes (Signed)
Subjective:  Patient ID: Abigail Gross, female    DOB: Apr 22, 1961,  MRN: [id number] HPI Chief Complaint  Patient presents with  . Toe Pain    3rd toe right - joint larger at tip of toe, concerned, more tender    59 y.o. female presents with the above complaint.   ROS: Denies fever chills nausea vomiting muscle aches pains calf pain back pain chest pain shortness of breath.  Past Medical History:  Diagnosis Date  . Anxiety   . Edema   . Enlarged uterus   . Fibroid   . GERD (gastroesophageal reflux disease)   . Hypertension   . Insomnia   . Ovarian cyst   . Shingles    Past Surgical History:  Procedure Laterality Date  . TUBAL LIGATION      Current Outpatient Medications:  .  albuterol (PROVENTIL HFA;VENTOLIN HFA) 108 (90 Base) MCG/ACT inhaler, Inhale 2 puffs into the lungs every 6 (six) hours as needed for wheezing or shortness of breath., Disp: 8.5 Inhaler, Rfl: 1 .  cetirizine (ZYRTEC) 10 MG tablet, Take 10 mg by mouth as needed. , Disp: , Rfl:  .  Cholecalciferol (VITAMIN D3) 25 MCG (1000 UT) CAPS, Take by mouth., Disp: , Rfl:  .  EUCRISA 2 % OINT, Apply     as directed QD/BID to AA's for eczema, Disp: , Rfl:  .  FLOVENT HFA 110 MCG/ACT inhaler, SMARTSIG:1 Inhalation Via Inhaler Twice Daily, Disp: , Rfl:  .  potassium gluconate 595 (99 K) MG TABS tablet, Take by mouth., Disp: , Rfl:  .  predniSONE (DELTASONE) 10 MG tablet, Take by mouth., Disp: , Rfl:  .  triamcinolone ointment (KENALOG) 0.1 %, SMARTSIG:Sparingly Topical Twice Daily, Disp: , Rfl:  .  triamterene-hydrochlorothiazide (MAXZIDE-25) 37.5-25 MG per tablet, Take 1 tablet by mouth daily. , Disp: , Rfl:  .  valACYclovir (VALTREX) 1000 MG tablet, 1,000 mg as needed. , Disp: , Rfl: 2  Allergies  Allergen Reactions  . Clonazepam Other (See Comments)    Felt depressed  . Sulfa Antibiotics Hives   Review of Systems Objective:  There were no vitals filed for this visit.  General: Well developed, nourished,  in no acute distress, alert and oriented x3   Dermatological: Skin is warm, dry and supple bilateral. Nails x 10 are well maintained; remaining integument appears unremarkable at this time. There are no open sores, no preulcerative lesions, no rash or signs of infection present.  Vascular: Dorsalis Pedis artery and Posterior Tibial artery pedal pulses are 2/4 bilateral with immedate capillary fill time. Pedal hair growth present. No varicosities and no lower extremity edema present bilateral.   Neruologic: Grossly intact via light touch bilateral. Vibratory intact via tuning fork bilateral. Protective threshold with Semmes Wienstein monofilament intact to all pedal sites bilateral. Patellar and Achilles deep tendon reflexes 2+ bilateral. No Babinski or clonus noted bilateral.   Musculoskeletal: No gross boney pedal deformities bilateral. No pain, crepitus, or limitation noted with foot and ankle range of motion bilateral. Muscular strength 5/5 in all groups tested bilateral.  Gait: Unassisted, Nonantalgic.    Radiographs:  Radiographs taken today demonstrate joint space narrowing plantar flexion of the DIPJ and what appears to be an old fracture to the lateral condyle of the base of the distal phalanx third toe right foot. This is now resulted in spurring of this joint medially and laterally.  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  Plan: Discussed  etiology pathology conservative versus surgical therapies at this point consented her for a arthroplasty DIPJ third digit right foot. We did discuss the possible postop complications which may include but not limited to postop pain bleeding swelling infection recurrence need for further surgery overcorrection under correction also digit loss of limb loss of life. Dispensed a Darco shoe dispensed information regarding the surgery center anesthesia group and instructions for the morning of  surgery.     Sissi Padia [employer]. [employer], [place]

## 2020-04-06 NOTE — Patient Instructions (Signed)
Pre-Operative Instructions  Congratulations, you have decided to take an important step towards improving your quality of life.  You can be assured that the doctors and staff at [facility] will be with you every step of the way.  Here are some important things you should know:  1. Plan to be at the surgery center/hospital at least 1 (one) hour prior to your scheduled time, unless otherwise directed by the surgical center/hospital staff.  You must have a responsible adult accompany you, remain during the surgery and drive you home.  Make sure you have directions to the surgical center/hospital to ensure you arrive on time. 2. If you are having surgery at [facility] or [facility] hospitals, you will need a copy of your medical history and physical form from your family physician within one month prior to the date of surgery. We will give you a form for your primary physician to complete.  3. We make every effort to accommodate the date you request for surgery.  However, there are times where surgery dates or times have to be moved.  We will contact you as soon as possible if a change in schedule is required.   4. No aspirin/ibuprofen for one week before surgery.  If you are on aspirin, any non-steroidal anti-inflammatory medications (Mobic, Aleve, Ibuprofen) should not be taken seven (7) days prior to your surgery.  You make take Tylenol for pain prior to surgery.  5. Medications - If you are taking daily heart and blood pressure medications, seizure, reflux, allergy, asthma, anxiety, pain or diabetes medications, make sure you notify the surgery center/hospital before the day of surgery so they can tell you which medications you should take or avoid the day of surgery. 6. No food or drink after midnight the night before surgery unless directed otherwise by surgical center/hospital staff. 7. No alcoholic beverages 24-hours prior to surgery.  No smoking 24-hours prior or 24-hours after  surgery. 8. Wear loose pants or shorts. They should be loose enough to fit over bandages, boots, and casts. 9. Don't wear slip-on shoes. Sneakers are preferred. 10. Bring your boot with you to the surgery center/hospital.  Also bring crutches or a walker if your physician has prescribed it for you.  If you do not have this equipment, it will be provided for you after surgery. 11. If you have not been contacted by the surgery center/hospital by the day before your surgery, call to confirm the date and time of your surgery. 12. Leave-time from work may vary depending on the type of surgery you have.  Appropriate arrangements should be made prior to surgery with your employer. 13. Prescriptions will be provided immediately following surgery by your doctor.  Fill these as soon as possible after surgery and take the medication as directed. Pain medications will not be refilled on weekends and must be approved by the doctor. 14. Remove nail polish on the operative foot and avoid getting pedicures prior to surgery. 15. Wash the night before surgery.  The night before surgery wash the foot and leg well with water and the antibacterial soap provided. Be sure to pay special attention to beneath the toenails and in between the toes.  Wash for at least three (3) minutes. Rinse thoroughly with water and dry well with a towel.  Perform this wash unless told not to do so by your physician.  Enclosed: 1 Ice pack (please put in freezer the night before surgery)   1 Hibiclens skin cleaner     Pre-op instructions  If you have any questions regarding the instructions, please do not hesitate to call our office.  [place]: [address], [place], [place] [address] -- [phone]  [place]: [address] [place], [place] [address] -- [phone]  [place]: [address], , [place] [address] -- [phone]   Website: [email or web address] 

## 2020-04-07 ENCOUNTER — Telehealth: Payer: Self-pay

## 2020-04-07 NOTE — Telephone Encounter (Signed)
Received surgery paperwork from the [place] office. Left a message for Abigail Gross to call back and schedule surgery at her convenience.

## 2020-04-27 ENCOUNTER — Ambulatory Visit (INDEPENDENT_AMBULATORY_CARE_PROVIDER_SITE_OTHER): Payer: Self-pay | Admitting: Dermatology

## 2020-04-27 ENCOUNTER — Other Ambulatory Visit: Payer: Self-pay

## 2020-04-27 DIAGNOSIS — L988 Other specified disorders of the skin and subcutaneous tissue: Secondary | ICD-10-CM

## 2020-04-27 NOTE — Progress Notes (Signed)
° °  Follow-Up Visit   Subjective  Abigail Gross is a 59 y.o. female who presents for the following: Facial Elastosis.  Patient would like Botox and filler today to oral commissures and chin. She is also interested in adding filler to ears.  The following portions of the chart were reviewed this encounter and updated as appropriate:  Tobacco   Allergies   Meds   Problems   Med Hx   Surg Hx   Fam Hx       Review of Systems:  No other skin or systemic complaints except as noted in HPI or Assessment and Plan.  Objective  Well appearing patient in no apparent distress; mood and affect are within normal limits.  A focused examination was performed including face, ears. Relevant physical exam findings are noted in the Assessment and Plan.  Objective  Head - Anterior (Face): Rhytides and volume loss.   Images                             Assessment & Plan  Elastosis of skin Head - Anterior (Face)  Resylane Defyne  Lot # [id number]  Exp:06/23/2021 Lot #[id number]  Exp: 08/23/2021  Botox Injection - Head - Anterior (Face) Location: See attached image  Informed consent: Discussed risks (infection, pain, bleeding, bruising, swelling, allergic reaction, paralysis of nearby muscles, eyelid droop, double vision, neck weakness, difficulty breathing, headache, undesirable cosmetic result, and need for additional treatment) and benefits of the procedure, as well as the alternatives.  Informed consent was obtained.  Preparation: The area was cleansed with alcohol.  Procedure Details:  Botox was injected into the dermis with a 30-gauge needle. Pressure applied to any bleeding. Ice packs offered for swelling.  Lot Number:  [id number] Expiration:  07/2022  Total Units Injected:  54  Plan: Patient was instructed to remain upright for 4 hours. Patient was instructed to avoid massaging the face and avoid vigorous exercise for the rest of the day. Tylenol may be used for  headache.  Allow 2 weeks before returning to clinic for additional dosing as needed. Patient will call for any problems.   Intralesional injection - Head - Anterior (Face) Prior to the procedure, the patient's past medical history, allergies and the rare but potential risks and complications were reviewed with the patient and a signed consent was obtained. Pre and post-treatment care was discussed and instructions provided.  Location: lips, marionette lines, and chin  Filler Type: Restylane defyne  Procedure: The area was prepped thoroughly with hibiclens The area was prepped thoroughly with Puracyn. After introducing the needle into the desired treatment area, the syringe plunger was drawn back to ensure there was no flash of blood prior to injecting the filler in order to minimize risk of intravascular injection and vascular occlusion.    Patient tolerated the procedure well. The patient will call with any problems, questions or concerns prior to their next appointment.   Return in about 2 weeks (around 05/11/2020) for filler/lips.  Graciella Belton, RMA, am acting as scribe for Forest Gleason, MD .  Documentation: I have reviewed the above documentation for accuracy and completeness, and I agree with the above.  Forest Gleason, MD

## 2020-05-11 ENCOUNTER — Encounter: Payer: Self-pay | Admitting: Dermatology

## 2020-05-11 ENCOUNTER — Ambulatory Visit (INDEPENDENT_AMBULATORY_CARE_PROVIDER_SITE_OTHER): Payer: BC Managed Care – PPO | Admitting: Dermatology

## 2020-05-11 ENCOUNTER — Other Ambulatory Visit: Payer: Self-pay

## 2020-05-11 DIAGNOSIS — L988 Other specified disorders of the skin and subcutaneous tissue: Secondary | ICD-10-CM | POA: Diagnosis not present

## 2020-05-11 DIAGNOSIS — L309 Dermatitis, unspecified: Secondary | ICD-10-CM | POA: Diagnosis not present

## 2020-05-11 MED ORDER — CLOBETASOL PROPIONATE 0.05 % EX OINT
TOPICAL_OINTMENT | CUTANEOUS | 0 refills | Status: DC
Start: 2020-05-11 — End: 2022-03-08

## 2020-05-11 NOTE — Patient Instructions (Signed)
Recommend Gold Bond Rapid Relief Anti-Itch cream up to 3 times per day to areas that are itchy.  Topical steroids (such as triamcinolone, fluocinolone, fluocinonide, mometasone, clobetasol, halobetasol, betamethasone, hydrocortisone) can cause thinning and lightening of the skin if they are used for too long in the same area. Your physician has selected the right strength medicine for your problem and area affected on the body. Please use your medication only as directed by your physician to prevent side effects.

## 2020-05-11 NOTE — Progress Notes (Signed)
Follow-Up Visit   Subjective  Abigail Gross is a 59 y.o. female who presents for the following: Facial Elastosis and itch at arm  Patient here for filler at the lips.  She also notes she continues to have significant itch even though her persistent rash has improved significantly. She notes significant improvement with gluten avoidance though she has not avoided it completely. She is using triamcinolone cream and eucrisa with improvement but without resolution of itch. Currently it is most bothersome at her right upper arm and shoulder area.   The following portions of the chart were reviewed this encounter and updated as appropriate:  Tobacco  Allergies  Meds  Problems  Med Hx  Surg Hx  Fam Hx      Review of Systems:  No other skin or systemic complaints except as noted in HPI or Assessment and Plan.  Objective  Well appearing patient in no apparent distress; mood and affect are within normal limits.  A focused examination was performed including face, neck, chest, arms, hands. Relevant physical exam findings are noted in the Assessment and Plan.  Objective  Right Shoulder - Anterior: Erythematous patches  Objective  Lips: Rhytides and volume loss.  Photos of earlobes are 2 weeks post filler with Restylane Defyne.    Did not get photos of lips after filler today  Images                         Assessment & Plan  Dermatitis Right Shoulder - Anterior  Chronic, still flared though improved  Severe pruritus with erythema at right arm  Patient with history of widespread dermatitis and severe pruritus with dramatic improvement on gluten free (not absolutely strict) diet but no history of blisters or predilection for extensor surfaces  Discussed possibility of dermatitis herpetiformis even though this is not the classic presentation and diagnosis by punch biopsy She defers today but may call when she has an active area which is in a more  preferable biopsy location for her  Continue triamcinolone cream each morning Start clobetasol 0.05% ointment at night as needed Avoid applying to face, groin, and axilla. Use as directed. Risk of skin atrophy with long-term use reviewed.   Recommend trial of strict gluten avoidance to see if she has further resolution of pruritus and rash  Recommend Gold Bond Rapid Relief Anti-Itch cream up to 3 times per day to areas that are itchy.  D/c eucrisa  Topical steroids (such as triamcinolone, fluocinolone, fluocinonide, mometasone, clobetasol, halobetasol, betamethasone, hydrocortisone) can cause thinning and lightening of the skin if they are used for too long in the same area. Your physician has selected the right strength medicine for your problem and area affected on the body. Please use your medication only as directed by your physician to prevent side effects.    clobetasol ointment (TEMOVATE) 0.05 % - Right Shoulder - Anterior  Elastosis of skin Lips  Botox Injection - Lips Location: See attached image  Informed consent: Discussed risks (infection, pain, bleeding, bruising, swelling, allergic reaction, paralysis of nearby muscles, eyelid droop, double vision, neck weakness, difficulty breathing, headache, undesirable cosmetic result, and need for additional treatment) and benefits of the procedure, as well as the alternatives.  Informed consent was obtained.  Preparation: The area was cleansed with alcohol.  Procedure Details:  Botox was injected into the dermis with a 30-gauge needle. Pressure applied to any bleeding. Ice packs offered for swelling.  Lot Number:  [id number]  Expiration:  07/2022  Total Units Injected:  6  Plan: Patient was instructed to remain upright for 4 hours. Patient was instructed to avoid massaging the face and avoid vigorous exercise for the rest of the day. Tylenol may be used for headache.  Allow 2 weeks before returning to clinic for additional dosing as  needed. Patient will call for any problems.   Filling material injection - Lips Prior to the procedure, the patient's past medical history, allergies and the rare but potential risks and complications were reviewed with the patient and a signed consent was obtained. Pre and post-treatment care was discussed and instructions provided.  Location: lips  Filler Type: Restylane refyne  Procedure: Lidocaine-tetracaine ointment was applied to treatment areas to achieve good local anesthesia. The area was prepped thoroughly with hibiclens The area was prepped thoroughly with Puracyn. After introducing the needle into the desired treatment area, the syringe plunger was drawn back to ensure there was no flash of blood prior to injecting the filler in order to minimize risk of intravascular injection and vascular occlusion. After injection of the filler, the treated areas were cleansed and iced to reduce swelling. Post-treatment instructions were reviewed with the patient.     Lot # [id number] Exp: 01/21/2021    Patient tolerated the procedure well. The patient will call with any problems, questions or concerns prior to their next appointment.   Return in about 3 months (around 08/11/2020) for Botox.  Graciella Belton, RMA, am acting as scribe for Forest Gleason, MD .  Documentation: I have reviewed the above documentation for accuracy and completeness, and I agree with the above.  Forest Gleason, MD

## 2020-05-23 ENCOUNTER — Encounter: Payer: Self-pay | Admitting: Dermatology

## 2020-08-10 ENCOUNTER — Other Ambulatory Visit: Payer: Self-pay

## 2020-08-10 ENCOUNTER — Ambulatory Visit (INDEPENDENT_AMBULATORY_CARE_PROVIDER_SITE_OTHER): Payer: Self-pay | Admitting: Dermatology

## 2020-08-10 DIAGNOSIS — L988 Other specified disorders of the skin and subcutaneous tissue: Secondary | ICD-10-CM

## 2020-08-10 DIAGNOSIS — D179 Benign lipomatous neoplasm, unspecified: Secondary | ICD-10-CM

## 2020-08-10 NOTE — Progress Notes (Signed)
   Follow-Up Visit   Subjective  Abigail Gross is a 59 y.o. female who presents for the following: Facial Elastosis (pt here for botox injections).   The following portions of the chart were reviewed this encounter and updated as appropriate: Tobacco  Allergies  Meds  Problems  Med Hx  Surg Hx  Fam Hx      Review of Systems: No other skin or systemic complaints except as noted in HPI or Assessment and Plan.   Objective  Well appearing patient in no apparent distress; mood and affect are within normal limits.  A focused examination was performed including face. Relevant physical exam findings are noted in the Assessment and Plan.  Objective  Head - Anterior (Face): Rhytides and volume loss.   Images    Objective  Neck - Anterior: Favor lipoma over normal fat pad  Assessment & Plan  Elastosis of skin Head - Anterior (Face)  Botox Injection - Head - Anterior (Face) Location: See attached image  Informed consent: Discussed risks (infection, pain, bleeding, bruising, swelling, allergic reaction, paralysis of nearby muscles, eyelid droop, double vision, neck weakness, difficulty breathing, headache, undesirable cosmetic result, and need for additional treatment) and benefits of the procedure, as well as the alternatives.  Informed consent was obtained.  Preparation: The area was cleansed with alcohol.  Procedure Details:  Botox was injected into the dermis with a 30-gauge needle. Pressure applied to any bleeding. Ice packs offered for swelling.  Lot Number:  [id number] Expiration:  09/2022  Total Units Injected:  60  Plan: Patient was instructed to remain upright for 4 hours. Patient was instructed to avoid massaging the face and avoid vigorous exercise for the rest of the day. Tylenol may be used for headache.  Allow 2 weeks before returning to clinic for additional dosing as needed. Patient will call for any problems.   Lipoma, unspecified site Neck -  Anterior  Recommend seeing Dr. Brunilda Payor for treatment.   No follow-ups on file.   I, Harriett Sine, CMA, am acting as scribe for Forest Gleason, MD.  Documentation: I have reviewed the above documentation for accuracy and completeness, and I agree with the above.  Forest Gleason, MD

## 2020-08-22 ENCOUNTER — Encounter: Payer: Self-pay | Admitting: Dermatology

## 2020-09-26 ENCOUNTER — Other Ambulatory Visit: Payer: Self-pay | Admitting: Dermatology

## 2020-10-27 ENCOUNTER — Encounter: Payer: BC Managed Care – PPO | Admitting: Dermatology

## 2020-11-09 ENCOUNTER — Ambulatory Visit: Payer: BC Managed Care – PPO | Admitting: Dermatology

## 2021-02-08 ENCOUNTER — Other Ambulatory Visit: Payer: Self-pay

## 2021-02-08 ENCOUNTER — Ambulatory Visit (INDEPENDENT_AMBULATORY_CARE_PROVIDER_SITE_OTHER): Payer: BC Managed Care – PPO | Admitting: Dermatology

## 2021-02-08 DIAGNOSIS — L988 Other specified disorders of the skin and subcutaneous tissue: Secondary | ICD-10-CM

## 2021-02-08 NOTE — Patient Instructions (Signed)

## 2021-02-08 NOTE — Progress Notes (Signed)
   Follow-Up Visit   Subjective  Abigail Gross is a 60 y.o. female who presents for the following: Facial Elastosis (Patient is here today for Botox and filler).  The following portions of the chart were reviewed this encounter and updated as appropriate:   Tobacco  Allergies  Meds  Problems  Med Hx  Surg Hx  Fam Hx     Review of Systems:  No other skin or systemic complaints except as noted in HPI or Assessment and Plan.  Objective  Well appearing patient in no apparent distress; mood and affect are within normal limits.  A focused examination was performed including the face. Relevant physical exam findings are noted in the Assessment and Plan.  Objective  Face: Rhytides and volume loss.   Images                                      Assessment & Plan  Elastosis of skin Face  Botox Injection - Face Location: See attached image  Informed consent: Discussed risks (infection, pain, bleeding, bruising, swelling, allergic reaction, paralysis of nearby muscles, eyelid droop, double vision, neck weakness, difficulty breathing, headache, undesirable cosmetic result, and need for additional treatment) and benefits of the procedure, as well as the alternatives.  Informed consent was obtained.  Preparation: The area was cleansed with alcohol.  Procedure Details:  Botox was injected into the dermis with a 30-gauge needle. Pressure applied to any bleeding. Ice packs offered for swelling.  Lot Number:  [id number] Expiration:  01/2023  Total Units Injected:  53  Plan: Patient was instructed to remain upright for 4 hours. Patient was instructed to avoid massaging the face and avoid vigorous exercise for the rest of the day. Tylenol may be used for headache.  Allow 2 weeks before returning to clinic for additional dosing as needed. Patient will call for any problems.   Filling material injection - Face Prior to the procedure, the patient's past  medical history, allergies and the rare but potential risks and complications were reviewed with the patient and a signed consent was obtained. Pre and post-treatment care was discussed and instructions provided.  Location: perioral, chin, and earlobes   Filler Type: Restylane refyne  Lot # [id number]  Expiration date: 11/21/2021  Procedure: The area was prepped thoroughly with Puracyn. After introducing the needle into the desired treatment area, the syringe plunger was drawn back to ensure there was no flash of blood prior to injecting the filler in order to minimize risk of intravascular injection and vascular occlusion. After injection of the filler, the treated areas were cleansed and iced to reduce swelling. Post-treatment instructions were reviewed with the patient.       Patient tolerated the procedure well. The patient will call with any problems, questions or concerns prior to their next appointment.   Return in about 3 months (around 05/11/2021) for cosmetic - Botox .  Luther Redo, CMA, am acting as scribe for Sarina Ser, MD .  Documentation: I have reviewed the above documentation for accuracy and completeness, and I agree with the above.  Sarina Ser, MD

## 2021-02-17 ENCOUNTER — Encounter: Payer: Self-pay | Admitting: Dermatology

## 2021-03-28 ENCOUNTER — Other Ambulatory Visit: Payer: Self-pay

## 2021-03-28 ENCOUNTER — Encounter: Payer: Self-pay | Admitting: Obstetrics and Gynecology

## 2021-03-28 ENCOUNTER — Ambulatory Visit (INDEPENDENT_AMBULATORY_CARE_PROVIDER_SITE_OTHER): Payer: BC Managed Care – PPO | Admitting: Obstetrics and Gynecology

## 2021-03-28 VITALS — BP 157/83 | HR 69 | Ht 63.0 in | Wt 129.8 lb

## 2021-03-28 DIAGNOSIS — Z1231 Encounter for screening mammogram for malignant neoplasm of breast: Secondary | ICD-10-CM

## 2021-03-28 DIAGNOSIS — Z01419 Encounter for gynecological examination (general) (routine) without abnormal findings: Secondary | ICD-10-CM

## 2021-03-28 DIAGNOSIS — Z79890 Hormone replacement therapy (postmenopausal): Secondary | ICD-10-CM

## 2021-03-28 DIAGNOSIS — N951 Menopausal and female climacteric states: Secondary | ICD-10-CM | POA: Diagnosis not present

## 2021-03-28 MED ORDER — ESTRADIOL-NORETHINDRONE ACET 1-0.5 MG PO TABS: 1.0000 | ORAL_TABLET | Freq: Every day | ORAL | 0 refills | Status: DC

## 2021-03-28 NOTE — Progress Notes (Signed)
HPI:      [name] is a 60 y.o. [id number] who LMP was Patient's last menstrual period was 08/24/2016 (exact date).  Subjective:   She presents today she is up-to-date on Pap smears but needs a mammogram this year.  She has complaints of hot flashes and occasional vaginal dryness.  She says she has not really been educated on HRT but is interested in hearing about it.    Hx: The following portions of the patient's history were reviewed and updated as appropriate:             She  has a past medical history of Anxiety, Edema, Enlarged uterus, Fibroid, GERD (gastroesophageal reflux disease), Hypertension, Insomnia, Ovarian cyst, and Shingles. She does not have any pertinent problems on file. She  has a past surgical history that includes Tubal ligation. Her family history includes Breast cancer (age of onset: 32) in her maternal aunt; Colon cancer in her paternal grandmother; Diabetes in her paternal grandfather; Hypertension in her father and mother. She  reports that she has never smoked. She has never used smokeless tobacco. She reports current alcohol use of about 1.0 standard drink of alcohol per week. She reports that she does not use drugs. She has a current medication list which includes the following prescription(s): albuterol, cetirizine, vitamin d3, clobetasol ointment, duloxetine, eucrisa, flovent hfa, potassium gluconate, triamcinolone ointment, triamterene-hydrochlorothiazide, valacyclovir, and estradiol-norethindrone. She is allergic to clonazepam and sulfa antibiotics.       Review of Systems:  Review of Systems  Constitutional: Denied constitutional symptoms, night sweats, recent illness, fatigue, fever, insomnia and weight loss.  Eyes: Denied eye symptoms, eye pain, photophobia, vision change and visual disturbance.  Ears/Nose/Throat/Neck: Denied ear, nose, throat or neck symptoms, hearing loss, nasal discharge, sinus congestion and sore throat.  Cardiovascular: Denied  cardiovascular symptoms, arrhythmia, chest pain/pressure, edema, exercise intolerance, orthopnea and palpitations.  Respiratory: Denied pulmonary symptoms, asthma, pleuritic pain, productive sputum, cough, dyspnea and wheezing.  Gastrointestinal: Denied, gastro-esophageal reflux, melena, nausea and vomiting.  Genitourinary: Denied genitourinary symptoms including symptomatic vaginal discharge, pelvic relaxation issues, and urinary complaints.  Musculoskeletal: Denied musculoskeletal symptoms, stiffness, swelling, muscle weakness and myalgia.  Dermatologic: Denied dermatology symptoms, rash and scar.  Neurologic: Denied neurology symptoms, dizziness, headache, neck pain and syncope.  Psychiatric: Denied psychiatric symptoms, anxiety and depression.  Endocrine: Denied endocrine symptoms including hot flashes and night sweats.   Meds:   Current Outpatient Medications on File Prior to Visit  Medication Sig Dispense Refill   albuterol (PROVENTIL HFA;VENTOLIN HFA) 108 (90 Base) MCG/ACT inhaler Inhale 2 puffs into the lungs every 6 (six) hours as needed for wheezing or shortness of breath. 8.5 Inhaler 1   cetirizine (ZYRTEC) 10 MG tablet Take 10 mg by mouth as needed.      Cholecalciferol (VITAMIN D3) 25 MCG (1000 UT) CAPS Take by mouth.     clobetasol ointment (TEMOVATE) 0.05 % Apply to affected areas nightly as needed for itch. Avoid face, groin, underarms. 45 g 0   DULoxetine (CYMBALTA) 30 MG capsule Take by mouth.     EUCRISA 2 % OINT Apply     as directed QD/BID to AA's for eczema     FLOVENT HFA 110 MCG/ACT inhaler SMARTSIG:1 Inhalation Via Inhaler Twice Daily     potassium gluconate 595 (99 K) MG TABS tablet Take by mouth.     triamcinolone ointment (KENALOG) 0.1 % SMARTSIG:Sparingly Topical Twice Daily     triamterene-hydrochlorothiazide (MAXZIDE-25) 37.5-25 MG per tablet Take  1 tablet by mouth daily.      valACYclovir (VALTREX) 1000 MG tablet 1,000 mg as needed.   2   No current  facility-administered medications on file prior to visit.          Objective:     Vitals:   03/28/21 1005  BP: (!) 157/83  Pulse: 69   Filed Weights   03/28/21 1005  Weight: 129 lb 12.8 oz (58.9 kg)              Physical examination General NAD, Conversant  HEENT Atraumatic; Op clear with mmm.  Normo-cephalic. Pupils reactive. Anicteric sclerae  Thyroid/Neck Smooth without nodularity or enlargement. Normal ROM.  Neck Supple.  Skin No rashes, lesions or ulceration. Normal palpated skin turgor. No nodularity.  Breasts: No masses or discharge.  Symmetric.  No axillary adenopathy.  Lungs: Clear to auscultation.No rales or wheezes. Normal Respiratory effort, no retractions.  Heart: NSR.  No murmurs or rubs appreciated. No periferal edema  Abdomen: Soft.  Non-tender.  No masses.  No HSM. No hernia  Extremities: Moves all appropriately.  Normal ROM for age. No lymphadenopathy.  Neuro: Oriented to PPT.  Normal mood. Normal affect.     Pelvic:   Vulva: Normal appearance.  No lesions.  Vagina: No lesions or abnormalities noted.  Moderate vaginal atrophy noted  Support: Normal pelvic support.  Urethra No masses tenderness or scarring.  Meatus Normal size without lesions or prolapse.  Cervix: Normal appearance.  No lesions.  Anus: Normal exam.  No lesions.  Perineum: Normal exam.  No lesions.        Bimanual   Uterus: Normal size.  Non-tender.  Mobile.  AV.  Adnexae: No masses.  Non-tender to palpation.  Cul-de-sac: Negative for abnormality.     Assessment:    G2P2002 Patient Active Problem List   Diagnosis Date Noted   Bronchospastic airway disease 04/06/2019   GERD (gastroesophageal reflux disease) 04/06/2019   Ganglion cyst of finger of right hand 06/30/2018   ADD (attention deficit disorder) 09/28/2015   Insomnia, persistent 09/28/2015   SOB (shortness of breath) 08/03/2014   Essential hypertension 08/03/2014   Numbness in feet 08/03/2014   Screen for colon cancer  06/11/2013     1. Well woman exam with routine gynecological exam   2. Screening mammogram for breast cancer   3. Symptomatic menopausal or female climacteric states   4. Postmenopausal hormone therapy     Patient with symptomatic menopause.  She states that she has been in menopause for approximately 4 years.   Plan:            1.  Basic Screening Recommendations The basic screening recommendations for asymptomatic women were discussed with the patient during her visit.  The age-appropriate recommendations were discussed with her and the rational for the tests reviewed.  When I am informed by the patient that another primary care physician has previously obtained the age-appropriate tests and they are up-to-date, only outstanding tests are ordered and referrals given as necessary.  Abnormal results of tests will be discussed with her when all of her results are completed.  Routine preventative health maintenance measures emphasized: Exercise/Diet/Weight control, Tobacco Warnings, Alcohol/Substance use risks and Stress Management Mammogram ordered  2.  HRT I have discussed HRT with the patient in detail.  The risk/benefits of it were reviewed.  She understands that during menopause Estrogen decreases dramatically and that this results in an increased risk of cardiovascular disease as well as osteoporosis.  We have also discussed the fact that hot flashes often result from a decrease in Estrogen, and that by replacing Estrogen, they can often be alleviated.  We have discussed skin, vaginal and urinary tract changes that may also take place from this drop in Estrogen.  Emotional changes have also been linked to Estrogen and we have briefly discussed this.  The benefits of HRT including decrease in hot flashes, vaginal dryness, and osteoporosis were discussed.  The emotional benefit and a possible change in her cardiovascular risk profile was also reviewed.  The risks associated with Hormone  Replacement Therapy were also reviewed.  The use of unopposed Estrogen and its relationship to endometrial cancer was discussed.  The addition of Progesterone and its beneficial effect on endometrial cancer was also noted.  The fact that there has been no consistent definitive studies showing an increase in breast cancer in women who use HRT was discussed with the patient.  The possible side effects including breast tenderness, fluid retention, mood changes and vaginal bleeding were discussed.  The patient was informed that this is an elective medication and that she may choose not to take Hormone Replacement Therapy.  Literature on HRT was made available, and I believe that after answering all of the patient's questions.  Special emphasis on the WHI study, as well as several studies since that pertaining to the risks and benefits of estrogen replacement therapy were compared.  The possible limitations of these studies were discussed including the age stratification of the WHI study.  The possible increased role of Progesterone in these studies was discussed in detail.  Different types of hormone formulation and methods of taking hormone replacement therapy were discussed. Literature on HRT was made available, and I believe that after answering all of the patient's questions she has an adequate and informed understanding of the risks and benefits of HRT. Patient has elected to begin [place]. Orders Orders Placed This Encounter  Procedures   MM 3D SCREEN BREAST BILATERAL     Meds ordered this encounter  Medications   estradiol-norethindrone (ACTIVELLA) 1-0.5 MG tablet    Sig: Take 1 tablet by mouth daily.    Dispense:  90 tablet    Refill:  0      F/U  Return in about 3 months (around 06/28/2021). I spent 16 additional minutes involved in the care of this patient specifically discussing HRT risk benefits and options preparing to see the patient by obtaining and reviewing her medical history  (including labs, imaging tests and prior procedures), documenting clinical information in the electronic health record (EHR), counseling and coordinating care plans, writing and sending prescriptions, ordering tests or procedures and directly communicating with the patient by discussing pertinent items from her history and physical exam as well as detailing my assessment and plan as noted above so that she has an informed understanding.  All of her questions were answered.  Finis Bud, M.D. 03/28/2021 10:47 AM

## 2021-03-29 ENCOUNTER — Ambulatory Visit
Admission: RE | Admit: 2021-03-29 | Discharge: 2021-03-29 | Disposition: A | Discharge status: Home / Self Care | Payer: BC Managed Care – PPO | Source: Ambulatory Visit | Attending: Obstetrics and Gynecology | Admitting: Obstetrics and Gynecology

## 2021-03-29 DIAGNOSIS — Z1231 Encounter for screening mammogram for malignant neoplasm of breast: Secondary | ICD-10-CM | POA: Diagnosis not present

## 2021-06-20 ENCOUNTER — Encounter: Payer: Self-pay | Admitting: Dermatology

## 2021-06-20 ENCOUNTER — Other Ambulatory Visit: Payer: Self-pay

## 2021-06-20 ENCOUNTER — Ambulatory Visit (INDEPENDENT_AMBULATORY_CARE_PROVIDER_SITE_OTHER): Payer: Self-pay | Admitting: Dermatology

## 2021-06-20 DIAGNOSIS — L988 Other specified disorders of the skin and subcutaneous tissue: Secondary | ICD-10-CM

## 2021-06-20 NOTE — Patient Instructions (Addendum)
Will prescribe [facility] Anti-Aging Tretinoin 0.025%/Niacinamide/Vitamin C/Vitamin E/Turmeric/Resveratrol with Hyaluronic Acid. Apply pea sized amount nightly to the entire face.  The patient was advised this is not covered by insurance since it is made by a compounding pharmacy. They will receive an email to check out and the medication will be mailed to their home.   Topical retinoid medications like tretinoin can cause dryness and irritation when first started. Only apply a pea-sized amount to the entire affected area. Avoid applying it around the eyes, edges of mouth and creases at the nose. If you experience irritation, use a good moisturizer first and/or apply the medicine less often. If you are doing well with the medicine, you can increase how often you use it until you are applying every night. Be careful with sun protection while using this medication as it can make you sensitive to the sun. This medicine should not be used by pregnant women.   Instructions for [facility] Medications  One or more of your medications was sent to the [facility] mail order compounding pharmacy. You will receive an email from them and can purchase the medicine through that link. It will then be mailed to your home at the address you confirmed. If for any reason you do not receive an email from them, please check your spam folder. If you still do not find the email, please let us know. [facility] phone number is [phone].  If you have any questions or concerns for your doctor, please call our main line at [phone] and press option 4 to reach your doctor's medical assistant. If no one answers, please leave a voicemail as directed and we will return your call as soon as possible. Messages left after 4 pm will be answered the following business day.   You may also send [place] a message via [place]. We typically respond to MyChart messages within 1-2 business days.  For prescription refills,  please ask your pharmacy to contact our office. Our fax number is [phone].  If you have an urgent issue when the clinic is closed that cannot wait until the next business day, you can page your doctor at the number below.    Please note that while we do our best to be available for urgent issues outside of office hours, we are not available 24/7.   If you have an urgent issue and are unable to reach [place], you may choose to seek medical care at your doctor's office, retail clinic, urgent care center, or emergency room.  If you have a medical emergency, please immediately call 911 or go to the emergency department.  Pager Numbers  - Dr. Nehemiah Massed: [phone]  - Dr. Laurence Ferrari: [phone]  - Dr. Nicole Kindred: [phone]  In the event of inclement weather, please call our main line at [phone] for an update on the status of any delays or closures.  Dermatology Medication Tips: Please keep the boxes that topical medications come in in order to help keep track of the instructions about where and how to use these. Pharmacies typically print the medication instructions only on the boxes and not directly on the medication tubes.   If your medication is too expensive, please contact our office at [phone] option 4 or send [place] a message through [place].   We are unable to tell what your co-pay for medications will be in advance as this is different depending on your insurance coverage. However, we may be able to find a substitute medication at lower  cost or fill out paperwork to get insurance to cover a needed medication.   If a prior authorization is required to get your medication covered by your insurance company, please allow [place] 1-2 business days to complete this process.  Drug prices often vary depending on where the prescription is filled and some pharmacies may offer cheaper prices.  The website www.goodrx.com contains coupons for medications through different pharmacies. The prices  here do not account for what the cost may be with help from insurance (it may be cheaper with your insurance), but the website can give you the price if you did not use any insurance.  - You can print the associated coupon and take it with your prescription to the pharmacy.  - You may also stop by our office during regular business hours and pick up a GoodRx coupon card.  - If you need your prescription sent electronically to a different pharmacy, notify our office through [facility] or by phone at [phone] option 4.

## 2021-06-20 NOTE — Progress Notes (Signed)
   Follow-Up Visit   Subjective  Abigail Gross is a 60 y.o. female who presents for the following: Facial Elastosis (Patient here today for botox injections and filler. ).    The following portions of the chart were reviewed this encounter and updated as appropriate:   Tobacco  Allergies  Meds  Problems  Med Hx  Surg Hx  Fam Hx      Review of Systems:  No other skin or systemic complaints except as noted in HPI or Assessment and Plan.  Objective  Well appearing patient in no apparent distress; mood and affect are within normal limits.  A focused examination was performed including face. Relevant physical exam findings are noted in the Assessment and Plan.  face Rhytides and volume loss.       Assessment & Plan  Elastosis of skin face  Will prescribe [facility] Anti-Aging Tretinoin 0.025%/Niacinamide/Vitamin C/Vitamin E/Turmeric/Resveratrol with Hyaluronic Acid. Apply pea sized amount nightly to the entire face.  The patient was advised this is not covered by insurance since it is made by a compounding pharmacy. They will receive an email to check out and the medication will be mailed to their home.   Topical retinoid medications like tretinoin can cause dryness and irritation when first started. Only apply a pea-sized amount to the entire affected area. Avoid applying it around the eyes, edges of mouth and creases at the nose. If you experience irritation, use a good moisturizer first and/or apply the medicine less often. If you are doing well with the medicine, you can increase how often you use it until you are applying every night. Be careful with sun protection while using this medication as it can make you sensitive to the sun. This medicine should not be used by pregnant women.    Filling material injection - face Location: See attached image  Informed consent: Discussed risks (infection, pain, bleeding, bruising, swelling, allergic reaction, paralysis of  nearby muscles, eyelid droop, double vision, neck weakness, difficulty breathing, headache, undesirable cosmetic result, and need for additional treatment) and benefits of the procedure, as well as the alternatives.  Informed consent was obtained.  Preparation: The area was cleansed with alcohol.  Procedure Details:  Botox was injected into the dermis with a 30-gauge needle. Pressure applied to any bleeding. Ice packs offered for swelling.  Lot Number:  [id number] Expiration:  12/2022  Total Units Injected:  53  Plan: Tylenol may be used for headache.  Allow 2 weeks before returning to clinic for additional dosing as needed. Patient will call for any problems.   Return in about 4 weeks (around 07/18/2021) for filler.  Graciella Belton, RMA, am acting as scribe for Forest Gleason, MD .  Documentation: I have reviewed the above documentation for accuracy and completeness, and I agree with the above.  Forest Gleason, MD

## 2021-06-22 ENCOUNTER — Telehealth: Payer: Self-pay | Admitting: Obstetrics and Gynecology

## 2021-06-22 ENCOUNTER — Other Ambulatory Visit: Payer: Self-pay

## 2021-06-22 DIAGNOSIS — N951 Menopausal and female climacteric states: Secondary | ICD-10-CM

## 2021-06-22 DIAGNOSIS — Z79890 Hormone replacement therapy (postmenopausal): Secondary | ICD-10-CM

## 2021-06-22 MED ORDER — ESTRADIOL-NORETHINDRONE ACET 1-0.5 MG PO TABS: 1.0000 | ORAL_TABLET | Freq: Every day | ORAL | 3 refills | Status: DC

## 2021-06-22 NOTE — Telephone Encounter (Signed)
Pt called and states that she ran out of her medication (hormone replacement), states that she did not have any for today's dose. Pt asking it be called into [facility]. Please Advise.

## 2021-06-23 ENCOUNTER — Emergency Department
Admission: EM | Admit: 2021-06-23 | Discharge: 2021-06-23 | Disposition: A | Discharge status: Home / Self Care | Payer: BC Managed Care – PPO | Attending: Emergency Medicine | Admitting: Emergency Medicine

## 2021-06-23 ENCOUNTER — Other Ambulatory Visit: Payer: Self-pay

## 2021-06-23 ENCOUNTER — Emergency Department: Payer: BC Managed Care – PPO

## 2021-06-23 DIAGNOSIS — Z79899 Other long term (current) drug therapy: Secondary | ICD-10-CM | POA: Insufficient documentation

## 2021-06-23 DIAGNOSIS — S161XXA Strain of muscle, fascia and tendon at neck level, initial encounter: Secondary | ICD-10-CM | POA: Diagnosis not present

## 2021-06-23 DIAGNOSIS — S199XXA Unspecified injury of neck, initial encounter: Secondary | ICD-10-CM | POA: Diagnosis present

## 2021-06-23 DIAGNOSIS — R202 Paresthesia of skin: Secondary | ICD-10-CM | POA: Diagnosis not present

## 2021-06-23 DIAGNOSIS — Y92410 Unspecified street and highway as the place of occurrence of the external cause: Secondary | ICD-10-CM | POA: Diagnosis not present

## 2021-06-23 MED ORDER — CLONAZEPAM 0.25 MG PO TBDP
0.2500 mg | ORAL_TABLET | Freq: Once | ORAL | Status: AC
  Administered 2021-06-23: 0.25 mg via ORAL
  Filled 2021-06-23: qty 1
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
## 2021-06-27 ENCOUNTER — Encounter: Payer: BC Managed Care – PPO | Admitting: Obstetrics and Gynecology

## 2021-06-27 ENCOUNTER — Encounter: Payer: Self-pay | Admitting: Obstetrics and Gynecology

## 2021-07-04 ENCOUNTER — Other Ambulatory Visit: Payer: Self-pay

## 2021-07-04 ENCOUNTER — Ambulatory Visit (INDEPENDENT_AMBULATORY_CARE_PROVIDER_SITE_OTHER): Payer: Self-pay | Admitting: Dermatology

## 2021-07-04 DIAGNOSIS — L988 Other specified disorders of the skin and subcutaneous tissue: Secondary | ICD-10-CM

## 2021-07-04 NOTE — Progress Notes (Signed)
   Follow-Up Visit   Subjective  Abigail Gross is a 60 y.o. female who presents for the following: Facial Elastosis (Patient here today for filler. ).   The following portions of the chart were reviewed this encounter and updated as appropriate:   Tobacco  Allergies  Meds  Problems  Med Hx  Surg Hx  Fam Hx      Review of Systems:  No other skin or systemic complaints except as noted in HPI or Assessment and Plan.  Objective  Well appearing patient in no apparent distress; mood and affect are within normal limits.  A focused examination was performed including face. Relevant physical exam findings are noted in the Assessment and Plan.  face Rhytides and volume loss.               Assessment & Plan  Elastosis of skin face    Filling material injection - face Prior to the procedure, the patient's past medical history, allergies and the rare but potential risks and complications were reviewed with the patient and a signed consent was obtained. Pre and post-treatment care was discussed and instructions provided.  Location: perioral, earlobes  Filler Type: Restylane defyne  Procedure: The area was prepped thoroughly with Puracyn. After introducing the needle into the desired treatment area, the syringe plunger was drawn back to ensure there was no flash of blood prior to injecting the filler in order to minimize risk of intravascular injection and vascular occlusion.    Patient tolerated the procedure well. The patient will call with any problems, questions or concerns prior to their next appointment.   Return if symptoms worsen or fail to improve.  Graciella Belton, RMA, am acting as scribe for Forest Gleason, MD .  Documentation: I have reviewed the above documentation for accuracy and completeness, and I agree with the above.  Forest Gleason, MD

## 2021-07-05 ENCOUNTER — Encounter: Payer: Self-pay | Admitting: Dermatology

## 2021-07-11 ENCOUNTER — Encounter: Payer: BC Managed Care – PPO | Admitting: Obstetrics and Gynecology

## 2021-07-25 ENCOUNTER — Encounter: Payer: BC Managed Care – PPO | Admitting: Obstetrics and Gynecology

## 2021-08-30 ENCOUNTER — Other Ambulatory Visit: Payer: Self-pay

## 2021-08-30 ENCOUNTER — Ambulatory Visit (INDEPENDENT_AMBULATORY_CARE_PROVIDER_SITE_OTHER): Payer: Self-pay | Admitting: Dermatology

## 2021-08-30 DIAGNOSIS — L988 Other specified disorders of the skin and subcutaneous tissue: Secondary | ICD-10-CM

## 2021-08-30 NOTE — Progress Notes (Signed)
   Follow-Up Visit   Subjective  Abigail Gross is a 60 y.o. female who presents for the following: Facial Elastosis (Patient here today for botox injections. ).   The following portions of the chart were reviewed this encounter and updated as appropriate:   Tobacco  Allergies  Meds  Problems  Med Hx  Surg Hx  Fam Hx      Review of Systems:  No other skin or systemic complaints except as noted in HPI or Assessment and Plan.  Objective  Well appearing patient in no apparent distress; mood and affect are within normal limits.  A focused examination was performed including face. Relevant physical exam findings are noted in the Assessment and Plan.  face Rhytides and volume loss.       Assessment & Plan  Elastosis of skin face  Filling material injection - face Location: See attached image  Informed consent: Discussed risks (infection, pain, bleeding, bruising, swelling, allergic reaction, paralysis of nearby muscles, eyelid droop, double vision, neck weakness, difficulty breathing, headache, undesirable cosmetic result, and need for additional treatment) and benefits of the procedure, as well as the alternatives.  Informed consent was obtained.  Preparation: The area was cleansed with alcohol.  Procedure Details:  Botox was injected into the dermis with a 30-gauge needle. Pressure applied to any bleeding. Ice packs offered for swelling.  Lot Number:  [id number] Expiration:  11/2023  Total Units Injected:  59  Plan: Tylenol may be used for headache.  Allow 2 weeks before returning to clinic for additional dosing as needed. Patient will call for any problems.   Return in about 3 months (around 11/28/2021) for Botox.  Graciella Belton, RMA, am acting as scribe for Forest Gleason, MD .  Documentation: I have reviewed the above documentation for accuracy and completeness, and I agree with the above.  Forest Gleason, MD

## 2021-08-30 NOTE — Patient Instructions (Signed)

## 2021-09-06 ENCOUNTER — Encounter: Payer: Self-pay | Admitting: Dermatology

## 2021-09-23 ENCOUNTER — Other Ambulatory Visit: Payer: Self-pay

## 2021-09-23 ENCOUNTER — Encounter: Payer: Self-pay | Admitting: Emergency Medicine

## 2021-09-23 ENCOUNTER — Ambulatory Visit
Admission: EM | Admit: 2021-09-23 | Discharge: 2021-09-23 | Disposition: A | Discharge status: Home / Self Care | Payer: BC Managed Care – PPO | Attending: Emergency Medicine | Admitting: Emergency Medicine

## 2021-09-23 DIAGNOSIS — H6983 Other specified disorders of Eustachian tube, bilateral: Secondary | ICD-10-CM

## 2021-09-23 DIAGNOSIS — R42 Dizziness and giddiness: Secondary | ICD-10-CM

## 2021-09-23 MED ORDER — MECLIZINE HCL 12.5 MG PO TABS: 12.5000 mg | ORAL_TABLET | Freq: Three times a day (TID) | ORAL | 0 refills | Status: DC | PRN

## 2021-09-23 MED ORDER — PREDNISONE 10 MG (21) PO TBPK: ORAL_TABLET | Freq: Every day | ORAL | 0 refills | Status: DC

## 2021-09-23 NOTE — ED Provider Notes (Signed)
[facility]    CSN: [id number] Arrival date & time: 09/23/21  1328      History   Chief Complaint Chief Complaint  Patient presents with   Ear Fullness    HPI Abigail Gross is a 60 y.o. female.  Patient presents with 2 to 3-day history of ear fullness and ringing in ears.  She has also had 3-4 brief episodes of vertigo which she describes as the room spinning when lying down and changing position.  She also reports sinus congestion, postnasal drip, cough in the evenings and at night x 4 weeks.  She denies fever, chills, sore throat, shortness of breath, vomiting, diarrhea, or other symptoms.  Treatment attempted with OTC cold medicine.  Her medical history includes hypertension.  The history is provided by the patient and medical records.   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   GERD (gastroesophageal reflux disease) 04/06/2019   Ganglion cyst of finger of right hand 06/30/2018   ADD (attention deficit disorder) 09/28/2015   Insomnia, persistent 09/28/2015   SOB (shortness of breath) 08/03/2014   Essential hypertension 08/03/2014   Numbness in feet 08/03/2014   Screen for colon cancer 06/11/2013    Past Surgical History:  Procedure Laterality Date   TUBAL LIGATION      OB History     Gravida  2   Para  2   Term  2   Preterm      AB      Living  2      SAB      IAB      Ectopic      Multiple      Live Births  2            Home Medications    Prior to Admission medications   Medication Sig Start Date End Date Taking? Authorizing Provider  meclizine (ANTIVERT) 12.5 MG tablet Take 1 tablet (12.5 mg total) by mouth 3 (three) times daily as needed for dizziness. 09/23/21  Yes Sharion Balloon, NP  predniSONE (STERAPRED UNI-PAK 21 TAB) 10 MG (21) TBPK tablet Take by mouth daily. As directed 09/23/21  Yes Sharion Balloon, NP  albuterol (PROVENTIL HFA;VENTOLIN HFA) 108 (90 Base) MCG/ACT inhaler Inhale 2 puffs into the lungs every 6 (six) hours as needed for wheezing or shortness of breath. 10/10/17   Defrancesco, Alanda Slim, MD  cetirizine (ZYRTEC) 10 MG tablet Take 10 mg by mouth as needed.     [provider]  Cholecalciferol (VITAMIN D3) 25 MCG (1000 UT) CAPS Take by mouth.    [provider]  clobetasol ointment (TEMOVATE) 0.05 % Apply to affected areas nightly as needed for itch. Avoid face, groin, underarms. 05/11/20   Moye, [place], MD  DULoxetine (CYMBALTA) 30 MG capsule Take by mouth. 01/23/21   [provider]  estradiol-norethindrone (ACTIVELLA) 1-0.5 MG tablet Take 1 tablet by mouth daily. 06/22/21 09/20/21  Harlin Heys, MD  EUCRISA 2 % OINT Apply     as directed QD/BID to AA's for eczema 10/28/19   [provider]  FLOVENT HFA 110 MCG/ACT inhaler SMARTSIG:1 Inhalation Via  Inhaler Twice Daily 03/31/20   [provider]  potassium gluconate 595 (99 K) MG TABS tablet Take by mouth.    [provider]  triamcinolone ointment (KENALOG) 0.1 % SMARTSIG:Sparingly Topical Twice Daily 03/03/20   [provider]  triamterene-hydrochlorothiazide (MAXZIDE-25) 37.5-25 MG per tablet Take 1 tablet by mouth daily.     [provider]  valACYclovir (VALTREX) 1000 MG tablet 1,000 mg as needed.  07/07/14   [provider]    Family History Family History  Problem Relation Age of Onset   Hypertension Mother    Hypertension Father    Breast cancer Maternal Aunt 44   Colon cancer Paternal Grandmother    Diabetes Paternal Grandfather    Heart disease Neg Hx    Ovarian cancer Neg Hx     Social History Social History   Tobacco Use   Smoking status: Never   Smokeless tobacco: Never  Substance Use Topics    Alcohol use: Yes    Alcohol/week: 1.0 standard drink    Types: 1 Glasses of wine per week    Comment: 1 drink a day and more on weekedns   Drug use: No     Allergies   Clonazepam and Sulfa antibiotics   Review of Systems Review of Systems  Constitutional:  Negative for chills and fever.  HENT:  Positive for congestion, ear pain, hearing loss and postnasal drip. Negative for ear discharge and sore throat.   Respiratory:  Positive for cough. Negative for shortness of breath.   Cardiovascular:  Negative for chest pain and palpitations.  Gastrointestinal:  Negative for diarrhea and vomiting.  Skin:  Negative for color change and rash.  Neurological:  Positive for dizziness. Negative for seizures, syncope, facial asymmetry, weakness and numbness.  All other systems reviewed and are negative.   Physical Exam Triage Vital Signs ED Triage Vitals  Enc Vitals Group     BP 09/23/21 1449 (!) 142/88     Pulse Rate 09/23/21 1449 87     Resp 09/23/21 1449 16     Temp 09/23/21 1449 98.8 F (37.1 C)     Temp src --      SpO2 09/23/21 1449 99 %     Weight --      Height --      Head Circumference --      Peak Flow --      Pain Score 09/23/21 1451 0     Pain Loc --      Pain Edu? --      Excl. in [place]? --    No data found.  Updated Vital Signs BP (!) 142/88    Pulse 87    Temp 98.8 F (37.1 C)    Resp 16    LMP 08/24/2016 (Exact Date)    SpO2 99%   Visual Acuity Right Eye Distance:   Left Eye Distance:   Bilateral Distance:    Right Eye Near:   Left Eye Near:    Bilateral Near:     Physical Exam Vitals and nursing note reviewed.  Constitutional:      General: She is not in acute distress.    Appearance: She is well-developed. She is not ill-appearing.  HENT:     Head: Normocephalic and atraumatic.     Right Ear: Ear canal normal. A middle ear effusion is present.     Left Ear: Ear canal normal. A middle ear effusion is present.     Nose: Nose normal.  Mouth/Throat:      Mouth: Mucous membranes are moist.     Pharynx: Oropharynx is clear.  Cardiovascular:     Rate and Rhythm: Normal rate and regular rhythm.     Heart sounds: Normal heart sounds.  Pulmonary:     Effort: Pulmonary effort is normal. No respiratory distress.     Breath sounds: Normal breath sounds.  Abdominal:     Palpations: Abdomen is soft.     Tenderness: There is no abdominal tenderness.  Musculoskeletal:     Cervical back: Neck supple.  Skin:    General: Skin is warm and dry.  Neurological:     General: No focal deficit present.     Mental Status: She is alert and oriented to person, place, and time.     Sensory: No sensory deficit.     Motor: No weakness.     Gait: Gait normal.  Psychiatric:        Mood and Affect: Mood normal.        Behavior: Behavior normal.     UC Treatments / Results  Labs (all labs ordered are listed, but only abnormal results are displayed) Labs Reviewed - No data to display  EKG   Radiology No results found.  Procedures Procedures (including critical care time)  Medications Ordered in UC Medications - No data to display  Initial Impression / Assessment and Plan / UC Course  I have reviewed the triage vital signs and the nursing notes.  Pertinent labs & imaging results that were available during my care of the patient were reviewed by me and considered in my medical decision making (see chart for details).  Bilateral eustachian tube dysfunction, Vertigo.  Treating patient today with prednisone taper.  Also treating vertigo with meclizine as needed; precautions for drowsiness with this medication discussed.  Instructed patient to follow-up with her PCP next week if her symptoms are not improving.  ED precautions discussed.  Education provided on eustachian tube dysfunction and vertigo.  Patient agrees to plan of care.   Final Clinical Impressions(s) / UC Diagnoses   Final diagnoses:  Dysfunction of both eustachian tubes  Vertigo    Discharge Instructions   None    ED Prescriptions     Medication Sig Dispense Auth. Provider   predniSONE (STERAPRED UNI-PAK 21 TAB) 10 MG (21) TBPK tablet Take by mouth daily. As directed 21 tablet Sharion Balloon, NP   meclizine (ANTIVERT) 12.5 MG tablet Take 1 tablet (12.5 mg total) by mouth 3 (three) times daily as needed for dizziness. 30 tablet Sharion Balloon, NP      PDMP not reviewed this encounter.   Sharion Balloon, NP 09/23/21 1524

## 2021-09-23 NOTE — ED Triage Notes (Signed)
Over the last few days, pt states her ears have felt full and she has had bouts of vertigo after laying down. Hearing has been muffled and last week she had a slight cold that is better now.

## 2021-09-23 NOTE — Discharge Instructions (Addendum)
Take the prednisone as directed.    Take the meclizine as directed.  Do not drive, operate machinery, or drink alcohol with this medication as it may cause drowsiness.    Follow up with your primary care provider if your symptoms are not improving.  Go to the emergency department if you have acute worsening symptoms.

## 2021-11-28 ENCOUNTER — Other Ambulatory Visit: Payer: Self-pay

## 2021-11-28 ENCOUNTER — Ambulatory Visit (INDEPENDENT_AMBULATORY_CARE_PROVIDER_SITE_OTHER): Payer: Self-pay | Admitting: Dermatology

## 2021-11-28 DIAGNOSIS — L988 Other specified disorders of the skin and subcutaneous tissue: Secondary | ICD-10-CM

## 2021-11-28 NOTE — Patient Instructions (Signed)

## 2021-11-28 NOTE — Progress Notes (Signed)
? ?  Follow-Up Visit ?  ?Subjective  ?Abigail Gross is a 61 y.o. female who presents for the following: Facial Elastosis (Face, pt presents for Botox today). ? ?The following portions of the chart were reviewed this encounter and updated as appropriate:  ? Tobacco  Allergies  Meds  Problems  Med Hx  Surg Hx  Fam Hx   ?  ?Review of Systems:  No other skin or systemic complaints except as noted in HPI or Assessment and Plan. ? ?Objective  ?Well appearing patient in no apparent distress; mood and affect are within normal limits. ? ?A focused examination was performed including face. Relevant physical exam findings are noted in the Assessment and Plan. ? ?face ?Rhytides and volume loss.  ? ? ? ? ? ?Assessment & Plan  ?Elastosis of skin ?face ? ?Botox 59units injected today to: ?- Frown complex 20 units ?- Forehead 6 units ?- Crow's feet 10 units each, total of 20 units ?- Chin 5 units ?- DAO's 4 units each, total of 8 units ? ?Botox Injection - face ?Location: Frown complex, forehead, bil crow's feet, chin, bil DAO's ? ?Informed consent: Discussed risks (infection, pain, bleeding, bruising, swelling, allergic reaction, paralysis of nearby muscles, eyelid droop, double vision, neck weakness, difficulty breathing, headache, undesirable cosmetic result, and need for additional treatment) and benefits of the procedure, as well as the alternatives.  Informed consent was obtained. ? ?Preparation: The area was cleansed with alcohol. ? ?Procedure Details:  Botox was injected into the dermis with a 30-gauge needle. Pressure applied to any bleeding. Ice packs offered for swelling. ? ?Lot Number:  [id number] ?Expiration:  11/2023 ? ?Total Units Injected:  59 ? ?Plan: Patient was instructed to remain upright for 4 hours. Patient was instructed to avoid massaging the face and avoid vigorous exercise for the rest of the day. Tylenol may be used for headache.  Allow 2 weeks before returning to clinic for additional dosing as  needed. Patient will call for any problems. ? ? ? ?Return for 3-63mBotox. ? ?I, SOthelia Pulling RMA, am acting as scribe for DSarina Ser MD . ?Documentation: I have reviewed the above documentation for accuracy and completeness, and I agree with the above. ? ?DSarina Ser MD ? ?

## 2021-12-03 ENCOUNTER — Encounter: Payer: Self-pay | Admitting: Dermatology

## 2021-12-14 ENCOUNTER — Telehealth: Payer: Self-pay | Admitting: Obstetrics and Gynecology

## 2021-12-14 NOTE — Telephone Encounter (Signed)
Patient called and stated that she was seen by Dr.evans on 03/28/2021 for a physical as a new gyn. Pt stated he started her on a hormonal therapy treatment called Vansant. Pt stated that no pharmacy ever has this medication. Stated she normally get a 6 month supply. She missed her follow up visits on 07/11/21 and 07/25/2021. She was wondering if she could switch the medication to something more readily available to her. Would she need to come in for a visit to see evans or can this be done via tele visit. Please advise. ?

## 2021-12-15 ENCOUNTER — Other Ambulatory Visit: Payer: Self-pay | Admitting: Obstetrics and Gynecology

## 2021-12-15 MED ORDER — ACTIVELLA 1-0.5 MG PO TABS: 1.0000 | ORAL_TABLET | Freq: Every day | ORAL | 2 refills | Status: DC

## 2021-12-15 NOTE — Telephone Encounter (Signed)
LVM for patient and sent mychart message to update her ?

## 2022-02-27 ENCOUNTER — Encounter: Payer: BC Managed Care – PPO | Admitting: Obstetrics and Gynecology

## 2022-03-08 ENCOUNTER — Encounter: Payer: Self-pay | Admitting: Obstetrics and Gynecology

## 2022-03-08 ENCOUNTER — Ambulatory Visit (INDEPENDENT_AMBULATORY_CARE_PROVIDER_SITE_OTHER): Payer: BC Managed Care – PPO | Admitting: Obstetrics and Gynecology

## 2022-03-08 VITALS — BP 141/96 | HR 75 | Ht 62.5 in | Wt 137.8 lb

## 2022-03-08 DIAGNOSIS — R102 Pelvic and perineal pain: Secondary | ICD-10-CM

## 2022-03-08 NOTE — Progress Notes (Signed)
HPI:      [name] is a 61 y.o. [id number] who LMP was Patient's last menstrual period was 08/24/2016 (exact date).  Subjective:   She presents today with the following explanation by way of orientation.  She states in the last few years she has had 2 family life-changing events which have manifested by caused her to have recurrent nerve pain.  No one has been able to find the source of her pain. She now states that over the last several weeks she has felt pelvic pain and does not know if this is also a manifestation of her nerve pain versus some other issue.  She says that she can actively feel her ovaries on both sides and does not think this is normal.  She reports having intercourse without issue it does not make the pain worse.  She reports that the pain becomes worse toward the end of each day and in the morning seems to be better.  She reports normal bowel movements and urination. Patient is taking Activella and reports that she still has long periods of time during the day where she feels warmer than everyone else but denies hot flashes.  She plans to continue the [place].    Hx: The following portions of the patient's history were reviewed and updated as appropriate:             She  has a past medical history of Anxiety, Edema, Enlarged uterus, Fibroid, GERD (gastroesophageal reflux disease), Hypertension, Insomnia, Ovarian cyst, and Shingles. She does not have any pertinent problems on file. She  has a past surgical history that includes Tubal ligation. Her family history includes Breast cancer (age of onset: 18) in her maternal aunt; Colon cancer in her paternal grandmother; Diabetes in her paternal grandfather; Hypertension in her father and mother. She  reports that she has never smoked. She has never used smokeless tobacco. She reports current alcohol use of about 1.0 standard drink of alcohol per week. She reports that she does not use drugs. She has a current medication  list which includes the following prescription(s): activella, albuterol, cetirizine, vitamin d3, duloxetine, eucrisa, flovent hfa, potassium gluconate, triamterene-hydrochlorothiazide, valacyclovir, zolpidem, and estradiol-norethindrone. She is allergic to clonazepam and sulfa antibiotics.       Review of Systems:  Review of Systems  Constitutional: Denied constitutional symptoms, night sweats, recent illness, fatigue, fever, insomnia and weight loss.  Eyes: Denied eye symptoms, eye pain, photophobia, vision change and visual disturbance.  Ears/Nose/Throat/Neck: Denied ear, nose, throat or neck symptoms, hearing loss, nasal discharge, sinus congestion and sore throat.  Cardiovascular: Denied cardiovascular symptoms, arrhythmia, chest pain/pressure, edema, exercise intolerance, orthopnea and palpitations.  Respiratory: Denied pulmonary symptoms, asthma, pleuritic pain, productive sputum, cough, dyspnea and wheezing.  Gastrointestinal: Denied, gastro-esophageal reflux, melena, nausea and vomiting.  Genitourinary: See HPI for additional information.  Musculoskeletal: Denied musculoskeletal symptoms, stiffness, swelling, muscle weakness and myalgia.  Dermatologic: Denied dermatology symptoms, rash and scar.  Neurologic: Denied neurology symptoms, dizziness, headache, neck pain and syncope.  Psychiatric: Denied psychiatric symptoms, anxiety and depression.  Endocrine: Denied endocrine symptoms including hot flashes and night sweats.   Meds:   Current Outpatient Medications on File Prior to Visit  Medication Sig Dispense Refill   ACTIVELLA 1-0.5 MG tablet Take 1 tablet by mouth daily. 30 tablet 2   albuterol (PROVENTIL HFA;VENTOLIN HFA) 108 (90 Base) MCG/ACT inhaler Inhale 2 puffs into the lungs every 6 (six) hours as needed for wheezing or shortness of breath.  8.5 Inhaler 1   cetirizine (ZYRTEC) 10 MG tablet Take 10 mg by mouth as needed.      Cholecalciferol (VITAMIN D3) 25 MCG (1000 UT) CAPS  Take by mouth.     DULoxetine (CYMBALTA) 30 MG capsule Take by mouth.     EUCRISA 2 % OINT Apply     as directed QD/BID to AA's for eczema     FLOVENT HFA 110 MCG/ACT inhaler SMARTSIG:1 Inhalation Via Inhaler Twice Daily     potassium gluconate 595 (99 K) MG TABS tablet Take by mouth.     triamterene-hydrochlorothiazide (MAXZIDE-25) 37.5-25 MG per tablet Take 1 tablet by mouth daily.      valACYclovir (VALTREX) 1000 MG tablet 1,000 mg as needed.   2   zolpidem (AMBIEN) 10 MG tablet Take 10 mg by mouth at bedtime as needed for sleep.     estradiol-norethindrone (ACTIVELLA) 1-0.5 MG tablet Take 1 tablet by mouth daily. 90 tablet 3   No current facility-administered medications on file prior to visit.      Objective:     Vitals:   03/08/22 1230  BP: (!) 141/96  Pulse: 75   Filed Weights   03/08/22 1230  Weight: 137 lb 12.8 oz (62.5 kg)              Physical examination   Pelvic:   Vulva: Normal appearance.  No lesions.  Vagina: No lesions or abnormalities noted.  Support: Normal pelvic support.  Urethra No masses tenderness or scarring.  Meatus Normal size without lesions or prolapse.  Cervix: Normal appearance.  No lesions.  Anus: Normal exam.  No lesions.  Perineum: Normal exam.  No lesions.        Bimanual   Uterus: Normal size.  Non-tender.  Mobile.  AV.  Adnexae: No masses.  Non-tender to palpation.  Cul-de-sac: Negative for abnormality.             Assessment:    G2P2002 Patient Active Problem List   Diagnosis Date Noted   Bronchospastic airway disease 04/06/2019   GERD (gastroesophageal reflux disease) 04/06/2019   Ganglion cyst of finger of right hand 06/30/2018   ADD (attention deficit disorder) 09/28/2015   Insomnia, persistent 09/28/2015   SOB (shortness of breath) 08/03/2014   Essential hypertension 08/03/2014   Numbness in feet 08/03/2014   Screen for colon cancer 06/11/2013     1. Pelvic pain in female        Plan:            1.   Recommend ultrasound. Orders Orders Placed This Encounter  Procedures   US PELVIS (TRANSABDOMINAL ONLY)   US PELVIS TRANSVAGINAL NON-OB (TV ONLY)    No orders of the defined types were placed in this encounter.     F/U  No follow-ups on file. I spent 25 minutes involved in the care of this patient preparing to see the patient by obtaining and reviewing her medical history (including labs, imaging tests and prior procedures), documenting clinical information in the electronic health record (EHR), counseling and coordinating care plans, writing and sending prescriptions, ordering tests or procedures and in direct communicating with the patient and medical staff discussing pertinent items from her history and physical exam.  Finis Bud, M.D. 03/08/2022 1:02 PM

## 2022-03-08 NOTE — Progress Notes (Signed)
Patient presents today to discuss pelvic pain that has been ongoing for 2-3 months. She states an aching pain in her pelvic region. Patient states she would like to discuss her HRT, states her current medication does not seem to be helping maintain symptoms.

## 2022-03-14 ENCOUNTER — Other Ambulatory Visit: Payer: Self-pay | Admitting: Obstetrics and Gynecology

## 2022-03-14 ENCOUNTER — Ambulatory Visit (INDEPENDENT_AMBULATORY_CARE_PROVIDER_SITE_OTHER): Payer: BC Managed Care – PPO

## 2022-03-14 ENCOUNTER — Telehealth: Payer: Self-pay

## 2022-03-14 DIAGNOSIS — R102 Pelvic and perineal pain: Secondary | ICD-10-CM | POA: Diagnosis not present

## 2022-03-14 NOTE — Telephone Encounter (Signed)
Patient calling in after ultrasound today to discuss results. Please advise on next steps

## 2022-03-15 ENCOUNTER — Ambulatory Visit: Payer: BC Managed Care – PPO | Admitting: Dermatology

## 2022-03-16 ENCOUNTER — Telehealth: Payer: Self-pay | Admitting: Obstetrics and Gynecology

## 2022-03-16 NOTE — Telephone Encounter (Signed)
Patient called and states that she had an [place] on 03/14/2022. Patient states that she would like a call and her results to be read to her as she was told by provider results would be read to her the same day. Patient has concerns as pain has increased. States her pain level at the time of [place] was a 3 or 4 and is now at a 5/6. Please advise.

## 2022-03-19 ENCOUNTER — Other Ambulatory Visit: Payer: Self-pay | Admitting: Obstetrics and Gynecology

## 2022-03-19 NOTE — Telephone Encounter (Signed)
Advised and inquired via MyChart.

## 2022-03-21 ENCOUNTER — Ambulatory Visit (INDEPENDENT_AMBULATORY_CARE_PROVIDER_SITE_OTHER): Payer: BC Managed Care – PPO | Admitting: Dermatology

## 2022-03-21 DIAGNOSIS — L988 Other specified disorders of the skin and subcutaneous tissue: Secondary | ICD-10-CM

## 2022-03-21 NOTE — Progress Notes (Signed)
   Follow-Up Visit   Subjective  Abigail Gross is a 61 y.o. female who presents for the following: Facial Elastosis (Patient here today for Botox injections. ).   The following portions of the chart were reviewed this encounter and updated as appropriate:   Tobacco  Allergies  Meds  Problems  Med Hx  Surg Hx  Fam Hx      Review of Systems:  No other skin or systemic complaints except as noted in HPI or Assessment and Plan.  Objective  Well appearing patient in no apparent distress; mood and affect are within normal limits.  A focused examination was performed including face. Relevant physical exam findings are noted in the Assessment and Plan.  face Rhytides and volume loss.     Assessment & Plan  Elastosis of skin face  Botox 59 units injected today to: - Frown complex 20 units - Forehead 6 units - Crow's feet 10 units each, total of 20 units - Chin 5 units - DAO's 4 units each, total of 8 units - Brow lift 4 units each side, total of 8 units  Filling material injection - face Location: See attached image  Informed consent: Discussed risks (infection, pain, bleeding, bruising, swelling, allergic reaction, paralysis of nearby muscles, eyelid droop, double vision, neck weakness, difficulty breathing, headache, undesirable cosmetic result, and need for additional treatment) and benefits of the procedure, as well as the alternatives.  Informed consent was obtained.  Preparation: The area was cleansed with alcohol.  Procedure Details:  Botox was injected into the dermis with a 30-gauge needle. Pressure applied to any bleeding. Ice packs offered for swelling.  Lot Number:  [id number] Expiration:  01/2024  Total Units Injected:  67  Plan: Tylenol may be used for headache.  Allow 2 weeks before returning to clinic for additional dosing as needed. Patient will call for any problems.    Return in about 3 months (around 06/21/2022) for Botox.  Graciella Belton, RMA,  am acting as scribe for Forest Gleason, MD .  Documentation: I have reviewed the above documentation for accuracy and completeness, and I agree with the above.  Forest Gleason, MD

## 2022-03-21 NOTE — Patient Instructions (Addendum)
Recommend Theodis Shove, MD at [facility] [phone]   Due to recent changes in healthcare laws, you may see results of your pathology and/or laboratory studies on MyChart before the doctors have had a chance to review them. We understand that in some cases there may be results that are confusing or concerning to you. Please understand that not all results are received at the same time and often the doctors may need to interpret multiple results in order to provide you with the best plan of care or course of treatment. Therefore, we ask that you please give [place] 2 business days to thoroughly review all your results before contacting the office for clarification. Should we see a critical lab result, you will be contacted sooner.   If You Need Anything After Your Visit  If you have any questions or concerns for your doctor, please call our main line at [phone] and press option 4 to reach your doctor's medical assistant. If no one answers, please leave a voicemail as directed and we will return your call as soon as possible. Messages left after 4 pm will be answered the following business day.   You may also send [place] a message via [place]. We typically respond to MyChart messages within 1-2 business days.  For prescription refills, please ask your pharmacy to contact our office. Our fax number is [phone].  If you have an urgent issue when the clinic is closed that cannot wait until the next business day, you can page your doctor at the number below.    Please note that while we do our best to be available for urgent issues outside of office hours, we are not available 24/7.   If you have an urgent issue and are unable to reach [place], you may choose to seek medical care at your doctor's office, retail clinic, urgent care center, or emergency room.  If you have a medical emergency, please immediately call 911 or go to the emergency department.  Pager Numbers  - Dr. Nehemiah Massed:  [phone]  - Dr. Laurence Ferrari: [phone]  - Dr. Nicole Kindred: [phone]  In the event of inclement weather, please call our main line at [phone] for an update on the status of any delays or closures.  Dermatology Medication Tips: Please keep the boxes that topical medications come in in order to help keep track of the instructions about where and how to use these. Pharmacies typically print the medication instructions only on the boxes and not directly on the medication tubes.   If your medication is too expensive, please contact our office at [phone] option 4 or send [place] a message through [place].   We are unable to tell what your co-pay for medications will be in advance as this is different depending on your insurance coverage. However, we may be able to find a substitute medication at lower cost or fill out paperwork to get insurance to cover a needed medication.   If a prior authorization is required to get your medication covered by your insurance company, please allow [place] 1-2 business days to complete this process.  Drug prices often vary depending on where the prescription is filled and some pharmacies may offer cheaper prices.  The website www.goodrx.com contains coupons for medications through different pharmacies. The prices here do not account for what the cost may be with help from insurance (it may be cheaper with your insurance), but the website can give you the price if you did not use any insurance.  -  You can print the associated coupon and take it with your prescription to the pharmacy.  - You may also stop by our office during regular business hours and pick up a GoodRx coupon card.  - If you need your prescription sent electronically to a different pharmacy, notify our office through [facility] or by phone at [phone] option 4.     Si Usted Necesita Algo Despus de Su Visita  Tambin puede enviarnos un mensaje a travs de Pharmacist, community. Por lo general  respondemos a los mensajes de MyChart en el transcurso de 1 a 2 das hbiles.  Para renovar recetas, por favor pida a su farmacia que se ponga en contacto con nuestra oficina. Harland Dingwall de fax es [place] [phone].  Si tiene un asunto urgente cuando la clnica est cerrada y que no puede esperar hasta el siguiente da hbil, puede llamar/localizar a su doctor(a) al nmero que aparece a continuacin.   Por favor, tenga en cuenta que aunque hacemos todo lo posible para estar disponibles para asuntos urgentes fuera del horario de [place], no estamos disponibles las 24 horas del da, los 7 das de la [place].   Si tiene un problema urgente y no puede comunicarse con nosotros, puede optar por buscar atencin mdica  en el consultorio de su doctor(a), en una clnica privada, en un centro de atencin urgente o en una sala de emergencias.  Si tiene Engineering geologist, por favor llame inmediatamente al 911 o vaya a la sala de emergencias.  Nmeros de bper  - Dr. Nehemiah Massed: [phone]  - Dra. Moye: [phone]  - Dra. Nicole Kindred: [phone]  En caso de inclemencias del [place], por favor llame a Johnsie Kindred principal al [phone] para una actualizacin sobre el [place] de cualquier retraso o cierre.  Consejos para la medicacin en dermatologa: Por favor, guarde las cajas en las que vienen los medicamentos de uso tpico para ayudarle a seguir las instrucciones sobre dnde y cmo usarlos. Las farmacias generalmente imprimen las instrucciones del medicamento slo en las cajas y no directamente en los tubos del [place].   Si su medicamento es muy caro, por favor, pngase en contacto con Zigmund Daniel llamando al [phone] y presione la opcin 4 o envenos un mensaje a travs de Pharmacist, community.   No podemos decirle cul ser su copago por los medicamentos por adelantado ya que esto es diferente dependiendo de la cobertura de su seguro. Sin embargo, es posible que podamos encontrar un  medicamento sustituto a Electrical engineer un formulario para que el seguro cubra el medicamento que se considera necesario.   Si se requiere una autorizacin previa para que su compaa de seguros [place] su medicamento, por favor permtanos de 1 a 2 das hbiles para completar este proceso.  Los precios de los medicamentos varan con frecuencia dependiendo del Environmental consultant de dnde se surte la receta y alguna farmacias pueden ofrecer precios ms baratos.  El sitio web www.goodrx.com tiene cupones para medicamentos de Airline pilot. Los precios aqu no tienen en cuenta lo que podra costar con la ayuda del seguro (puede ser ms barato con su seguro), pero el sitio web puede darle el precio si no utiliz Research scientist (physical sciences).  - Puede imprimir el cupn correspondiente y llevarlo con su receta a la farmacia.  - Tambin puede pasar por nuestra oficina durante el horario de atencin regular y Charity fundraiser una tarjeta de cupones de GoodRx.  - Si necesita que su receta se enve electrnicamente a una farmacia diferente, informe  a nuestra oficina a travs de MyChart de [place] o por telfono llamando al [phone] y presione la opcin 4.

## 2022-03-22 ENCOUNTER — Encounter: Payer: Self-pay | Admitting: Obstetrics and Gynecology

## 2022-03-22 ENCOUNTER — Ambulatory Visit (INDEPENDENT_AMBULATORY_CARE_PROVIDER_SITE_OTHER): Payer: BC Managed Care – PPO | Admitting: Obstetrics and Gynecology

## 2022-03-22 VITALS — BP 128/87 | HR 76 | Resp 16 | Ht 63.0 in | Wt 133.9 lb

## 2022-03-22 DIAGNOSIS — D219 Benign neoplasm of connective and other soft tissue, unspecified: Secondary | ICD-10-CM | POA: Diagnosis not present

## 2022-03-22 DIAGNOSIS — F419 Anxiety disorder, unspecified: Secondary | ICD-10-CM

## 2022-03-22 DIAGNOSIS — Z79890 Hormone replacement therapy (postmenopausal): Secondary | ICD-10-CM

## 2022-03-22 DIAGNOSIS — R102 Pelvic and perineal pain: Secondary | ICD-10-CM | POA: Diagnosis not present

## 2022-03-22 NOTE — Patient Instructions (Signed)
Pelvic Pain, Female Pelvic pain is pain in your lower abdomen, below your belly button and between your hips. The pain may start suddenly (be acute), keep coming back (be recurring), or last a long time (become chronic). Pelvic pain that lasts longer than 6 months is considered chronic. Pelvic pain may affect your: Reproductive organs. Urinary system. Digestive tract. Musculoskeletal system. There are many potential causes of pelvic pain. Sometimes, the pain can be a result of digestive or urinary conditions, strained muscles or ligaments, or reproductive conditions. Sometimes the cause of pelvic pain is not known. Follow these instructions at home:  Take over-the-counter and prescription medicines only as told by your health care provider. Rest as told by your health care provider. Do not have sex if it hurts. Keep a journal of your pelvic pain. Write down: When the pain started. Where the pain is located. What seems to make the pain better or worse, such as food or your monthly period (menstrual cycle). Any symptoms you have along with the pain. Keep all follow-up visits. This is important. Contact a health care provider if: Medicine does not help your pain, or your pain comes back. You have new symptoms. You have abnormal vaginal discharge or bleeding, including bleeding after menopause. You have a fever or chills. You are constipated. You have blood in your urine or stool (feces). You have foul-smelling urine. You feel weak or light-headed. Get help right away if: You have sudden severe pain. Your pain gets steadily worse. You have severe pain along with fever, nausea, vomiting, or excessive sweating. You lose consciousness. These symptoms may represent a serious problem that is an emergency. Do not wait to see if the symptoms will go away. Get medical help right away. Call your local emergency services (911 in the [place] Do not drive yourself to the hospital. Summary Pelvic  pain is pain in your lower abdomen, below your belly button and between your hips. There are many potential causes of pelvic pain. Keep a journal of your pelvic pain. This information is not intended to replace advice given to you by your health care provider. Make sure you discuss any questions you have with your health care provider. Document Revised: 01/17/2021 Document Reviewed: 01/17/2021 Elsevier Patient Education  2023 Elsevier Inc.  

## 2022-03-22 NOTE — Progress Notes (Signed)
   Patient is a 61 y.o. [id number] female who presents for second opinion and evaluation of fibroids and ultrasound.   Chilton Greathouse, CMA [facility]

## 2022-03-22 NOTE — Progress Notes (Signed)
GYNECOLOGY PROGRESS NOTE  Subjective:    Patient ID: Abigail Gross, female    DOB: Dec 03, 1960, 61 y.o.   MRN: [id number]  HPI  Patient is a 61 y.o. G32P2002 female who presents for second opinion.  She was previously seen by Dr. Jeannie Fend for evaluation of new onset pelvic pain for the past 2 to 3 months.  She has a history of nerve pain, which normally is exacerbated in her left shoulder and across her chest intermittently.  However most recently patient has been noting some pelvic pain, feels like she can actively feel her ovaries on both sides and did not think that this was normal.  Intercourse is not deemed painful.  Denies issues with bowel movements or urination.  Patient is currently on HRT, reports that she normally feels warm however does not have active hot flashes but felt like HRT may help her symptoms as she was told by several friends and family.  Has recently stopped the medication as she now thinks that this may be a cause of her symptoms as well.  At her most recent visit with Dr. Amalia Hailey several weeks ago it was documented that patient had a normal pelvic exam.  Patient reports that she had a recent ultrasound and feels like she was given mixed reports regarding the findings of said ultrasound and would like a second opinion regarding these results and further options for management.  Reports that she was told that her ultrasound was overall normal and that her symptoms may be GI related and was offered a referral, however upon reading her report she noted some findings that she has questions about.   The following portions of the patient's history were reviewed and updated as appropriate: She  has a past medical history of Anxiety, Edema, Enlarged uterus, Fibroid, GERD (gastroesophageal reflux disease), Hypertension, Insomnia, Ovarian cyst, and Shingles.  She  has a past surgical history that includes Tubal ligation. Her family history includes Breast cancer (age of onset:  89) in her maternal aunt; Colon cancer in her paternal grandmother; Diabetes in her paternal grandfather; Hypertension in her father and mother.  She  reports that she has never smoked. She has never used smokeless tobacco. She reports current alcohol use of about 1.0 standard drink of alcohol per week. She reports that she does not use drugs.  Current Outpatient Medications on File Prior to Visit  Medication Sig Dispense Refill   albuterol (PROVENTIL HFA;VENTOLIN HFA) 108 (90 Base) MCG/ACT inhaler Inhale 2 puffs into the lungs every 6 (six) hours as needed for wheezing or shortness of breath. 8.5 Inhaler 1   cetirizine (ZYRTEC) 10 MG tablet Take 10 mg by mouth as needed.      Cholecalciferol (VITAMIN D3) 25 MCG (1000 UT) CAPS Take by mouth.     DULoxetine (CYMBALTA) 30 MG capsule Take by mouth.     EUCRISA 2 % OINT Apply     as directed QD/BID to AA's for eczema     FLOVENT HFA 110 MCG/ACT inhaler SMARTSIG:1 Inhalation Via Inhaler Twice Daily     potassium gluconate 595 (99 K) MG TABS tablet Take by mouth.     triamterene-hydrochlorothiazide (MAXZIDE-25) 37.5-25 MG per tablet Take 1 tablet by mouth daily.      valACYclovir (VALTREX) 1000 MG tablet 1,000 mg as needed.   2   zolpidem (AMBIEN) 10 MG tablet Take 10 mg by mouth at bedtime as needed for sleep.     estradiol-norethindrone (ACTIVELLA) 1-0.5  MG tablet Take 1 tablet by mouth daily. 90 tablet 3   No current facility-administered medications on file prior to visit.   She is allergic to clonazepam and sulfa antibiotics..  Review of Systems Pertinent items noted in HPI and remainder of comprehensive ROS otherwise negative.   Objective:   Blood pressure 128/87, pulse 76, resp. rate 16, height '5\' 3"'$  (1.6 m), weight 133 lb 14.4 oz (60.7 kg), last menstrual period 08/24/2016.  Body mass index is 23.72 kg/m.  General appearance: alert and no distress Remainder of exam deferred.    Imaging:  US PELVIC COMPLETE WITH  TRANSVAGINAL Patient Name: Abigail Gross DOB: [birth year] MRN: [id number]  ULTRASOUND REPORT  Location: [facility]  Date of Service: 03/14/2022   Indications:Pelvic Pain Findings:  The uterus is anteverted and measures 8.8 x 5.3 x 4.8 cm. Echo texture is heterogenous with evidence of focal masses. Within the uterus are multiple suspected fibroids measuring: Fibroid 1:  3.2 x 3.1 x 3.7 cm Fibroid 2:  1.2 x 1.1 x 1.0 cm There are several < 5 mm calcifications in the myometrium near the fundus.  The Endometrium measures 3.6 mm; there is fluid in the cervical portion of  the endometrial canal.  Ovaries are not visualized. Survey of the adnexa demonstrates no adnexal masses. There is no free fluid in the cul de sac.  Impression: 1. Fibroid uterus.  Recommendations: 1.Clinical correlation with the patient's History and Physical Exam.  Vivien Rota  H[place]Gainey   The ultrasound images and findings were reviewed by me and I agree with  the above report.  Finis Bud, M.D. 03/20/2022 11:35 AM    Assessment:   1. Pelvic pain in female   2. Postmenopausal hormone therapy   3. Anxiety   4. Fibroids      Plan:   1. Pelvic pain in female -Lengthy discussion had with patient regarding her pelvic pain symptoms and recent ultrasound results..  Discussed differential diagnosis including neuropathic pain, pelvic floor dysfunction, hyperstimulation of reproductive organs due to HRT, and less likely the fibroids that were noted on recent ultrasound.  Advised for patient to hold on her HRT for 1 to 2 months as she noted that this is around the time that her pain started when she initiated the medication.  If her pain resolves, would advise to discontinue her HRT as her symptoms sound more related to being generally hot-natured,  versus menopausal hot flushes.  If symptoms persist despite cessation of medication, offered referral to pelvic floor physical therapy which patient  notes she would like to try. - Ovaries were unable to be visualized on recent ultrasound, which did cause some concern for patient as she is worried about the possibility of ovarian cancer.  I discussed that overall patient is of low risk, and no other concerning findings were noted.  Patient reports that she has had some issues with abdominal bloating.  Does note a history of early satiety however notes that this is normal for her and is not a new occurrence.  Advised that if her pain persist or worsen, we could consider repeating her ultrasound in several months to visualize ovaries or could consider a different imaging modality.  Also could consider ordering a CA-125 to further reassure patient.  -Also discussed that pelvic pain can be broad, and can include disorders of the GI tract and the urinary tract.  Would encourage routine GI follow-up with colonoscopy as patient is likely due (notes last colonoscopy was at  age 32).  No urinary symptoms or issues at this time.  2. Postmenopausal hormone therapy See above discussion for pelvic pain.  Patient will hold on HRT for now.  If symptoms persist, can resume the HRT if she would like as it would not be the cause of her pain.  3. Anxiety -Currently on Cymbalta, also good for management with neuropathic pain.  However current pain does not seem to be responsive as patient has been on Cymbalta for several months.   4. Fibroids -Lengthy discussion was had with patient regarding the finding of the uterine fibroids.  Discussed that based on size and location that this was likely not the source of her pelvic pain.  Patient still concerned about the presence of the fibroids.  Advised that in menopause fibroids are typically asymptomatic and usually do not cause long-term issues.  Patient worried that use of hormonal therapy would cause growth of the fibroids, advised that in general this was not the case however if she was not comfortable with continuing the  medication she could discontinue.  Patient to follow-up in approximately 2 months if symptoms worsen or fail to improve.   I spent >40 minutes involved in the care of this patient preparing to see the patient by obtaining and reviewing her medical history (including labs, imaging tests and prior procedures), documenting clinical information in the electronic health record (EHR), counseling and coordinating care plans, and in direct communicating with the patient discussing pertinent items from her history and recent physical exam/imaging.   Rubie Maid, MD [facility]

## 2022-03-25 NOTE — Progress Notes (Signed)
Sandy: Based on this ultrasound, I do not think that any of your pelvic pain is directly attributable to the abnormalities on this ultrasound.  This is essentially a negative ultrasound for someone in menopause. Dr. Amalia Hailey

## 2022-03-27 ENCOUNTER — Encounter: Payer: Self-pay | Admitting: Dermatology

## 2022-04-27 ENCOUNTER — Ambulatory Visit (INDEPENDENT_AMBULATORY_CARE_PROVIDER_SITE_OTHER): Payer: BC Managed Care – PPO | Admitting: Obstetrics and Gynecology

## 2022-04-27 ENCOUNTER — Encounter: Payer: Self-pay | Admitting: Obstetrics and Gynecology

## 2022-04-27 VITALS — BP 131/101 | HR 87 | Resp 16 | Ht 63.0 in | Wt 135.0 lb

## 2022-04-27 DIAGNOSIS — M1611 Unilateral primary osteoarthritis, right hip: Secondary | ICD-10-CM | POA: Diagnosis not present

## 2022-04-27 DIAGNOSIS — R140 Abdominal distension (gaseous): Secondary | ICD-10-CM

## 2022-04-27 DIAGNOSIS — R102 Pelvic and perineal pain: Secondary | ICD-10-CM | POA: Diagnosis not present

## 2022-04-27 NOTE — Progress Notes (Signed)
    GYNECOLOGY PROGRESS NOTE  Subjective:    Patient ID: Abigail Gross, female    DOB: [birth year], 61 y.o.   MRN: [id number]  HPI  Patient is a 61 y.o. G48P2002 female who presents for follow up of pelvic pain. Patient has discontinued use of her HRT since last visit. She was also evaluated at [facility] and diagnosed with osteoarthritis in right hip. She is being treated with oral prednisone taper. Today her pain is mild, and she has not been in severe pain since she stopped taking the [place] and started taking the Prednisone. She reports she feels better, depression has improved and she has a clear mind.  Patient reports that she still has concerns that her ovaries were not viewed on her most recent ultrasound, and desires to be certain that there are no concerns with her ovaries (is worried about missing a cancer).    The following portions of the patient's history were reviewed and updated as appropriate: allergies, current medications, past family history, past medical history, past social history, past surgical history, and problem list.  Review of Systems Pertinent items are noted in HPI.   Objective:  Blood pressure (!) 131/101, pulse 87, resp. rate 16, height '5\' 3"'$  (1.6 m), weight 135 lb (61.2 kg), last menstrual period 08/24/2016.  Body mass index is 23.91 kg/m. General appearance: alert and no distress Remainder of exam deferred.    Assessment:     1. Pelvic pain in female   2. Abdominal bloating   3. Primary osteoarthritis of right hip    Plan:   1. Pelvic pain in female Patient reports that her pelvic pain has improved since her last visit approximately 1 month ago.  Is very happy thus far.  She has stopped her HRT and has noted improvement in her pain and also feels more clearheaded.  She does have questions regarding nonhormonal options for management of perimenopausal symptoms if they occur.  Given handout on herbal remedies.  Can also consider off-label use of  certain prescription medications if needed.  2. Abdominal bloating -Patient with history of abdominal bloating, and although is improving, patient still concerned about lack of visualization of her ovaries on her recent ultrasound.  I offered option of performing a CA125.  Discussed that if this was normal she was not at risk for her bloating being caused by ovarian cancer.  If level is elevated we will consider repeat scanning at that time.  Patient okay with this plan. - CA 125 ordered.  3. Primary osteoarthritis of right hip -Patient currently on prednisone taper.  Also thinks that this was the cause of some of her pelvic pain as it has been easing since initiation of her prednisone taper.  Advised that with continued management her pain should reduce to be nonexistent at some point.  Also discussed weightbearing exercises and initiation of calcium and vitamin D if she is not already taking.   Patient to follow-up as needed.   Rubie Maid, MD [facility]

## 2022-04-28 LAB — CA 125: Cancer Antigen (CA) 125: 13.3 U/mL (ref 0.0–38.1)

## 2022-05-02 ENCOUNTER — Other Ambulatory Visit: Payer: Self-pay | Admitting: Obstetrics and Gynecology

## 2022-05-02 DIAGNOSIS — Z1231 Encounter for screening mammogram for malignant neoplasm of breast: Secondary | ICD-10-CM

## 2022-05-24 ENCOUNTER — Ambulatory Visit
Admission: RE | Admit: 2022-05-24 | Discharge: 2022-05-24 | Disposition: A | Discharge status: Home / Self Care | Payer: BC Managed Care – PPO | Source: Ambulatory Visit | Attending: Obstetrics and Gynecology | Admitting: Obstetrics and Gynecology

## 2022-05-24 DIAGNOSIS — Z1231 Encounter for screening mammogram for malignant neoplasm of breast: Secondary | ICD-10-CM | POA: Diagnosis present

## 2022-06-19 ENCOUNTER — Ambulatory Visit
Admission: EM | Admit: 2022-06-19 | Discharge: 2022-06-19 | Disposition: A | Discharge status: Home / Self Care | Payer: BC Managed Care – PPO

## 2022-06-19 DIAGNOSIS — K120 Recurrent oral aphthae: Secondary | ICD-10-CM

## 2022-06-19 MED ORDER — NYSTATIN 100000 UNIT/ML MT SUSP: 5.0000 mL | Freq: Three times a day (TID) | OROMUCOSAL | 0 refills | Status: AC

## 2022-06-19 NOTE — ED Triage Notes (Signed)
Pt. Is c/o discharge and pain in her left ring finger that occurred over 2 weeks ago. Pt. States after a green bump appeared she sterilized a needle and popped it herself. Pt. States after that the discharge and throbbing feeling in her finger came back.

## 2022-06-19 NOTE — ED Provider Notes (Signed)
[facility]    CSN: [id number] Arrival date & time: 06/19/22  1111      History   Chief Complaint Chief Complaint  Patient presents with   Hand Pain    HPI DNIYAH GRANT is a 61 y.o. female.    Hand Pain    Presents to urgent care with complaint of infection near the nail of her left ring finger x2 weeks.  She states the infection started after getting her nails done.  Self treated at home by sterilizing a needle and "popping the green bump" which relieved the purulent discharge contained within.  She states symptoms were resolved until she went for another manicure after which discharge and throbbing feeling in her finger returned.  She reports the finger looks better today with less redness and no discharge.  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   GERD (gastroesophageal reflux disease) 04/06/2019   Ganglion cyst of finger of right hand 06/30/2018   ADD (attention deficit disorder) 09/28/2015   Insomnia, persistent 09/28/2015   SOB (shortness of breath) 08/03/2014   Essential hypertension 08/03/2014   Numbness in feet 08/03/2014   Screen for colon cancer 06/11/2013    Past Surgical History:  Procedure Laterality Date   TUBAL LIGATION      OB History     Gravida  2   Para  2   Term  2   Preterm      AB      Living  2      SAB      IAB      Ectopic      Multiple      Live Births  2            Home Medications    Prior to Admission medications   Medication Sig Start Date End Date Taking? Authorizing Provider  meloxicam (MOBIC) 15 MG tablet Take 1 tablet by mouth daily as needed. 01/19/22  Yes [provider]  valACYclovir (VALTREX) 1000 MG tablet Take 1 tablet by  mouth 2 (two) times daily. 12/26/21  Yes [provider]  albuterol (PROVENTIL HFA;VENTOLIN HFA) 108 (90 Base) MCG/ACT inhaler Inhale 2 puffs into the lungs every 6 (six) hours as needed for wheezing or shortness of breath. 10/10/17   Defrancesco, Alanda Slim, MD  ALPRAZolam Duanne Moron) 0.5 MG tablet     [provider]  amoxicillin-clavulanate (AUGMENTIN) 875-125 MG tablet     [provider]  benzonatate (TESSALON) 200 MG capsule     [provider]  cefadroxil (DURICEF) 500 MG capsule TAKE 1 EVERY 12HRS    [provider]  cetirizine (ZYRTEC) 10 MG tablet Take 10 mg by mouth as needed.     [provider]  cetirizine (ZYRTEC) 10 MG tablet Take 1 tablet by mouth daily as needed.    [provider]  Cholecalciferol (VITAMIN D3) 25 MCG (1000 UT) CAPS Take by mouth.    [provider]  clonazePAM (KLONOPIN) 0.5 MG tablet TAKE 1 TABLET (0.5 MG TOTAL) BY MOUTH 2 (TWO) TIMES DAILY AS NEEDED FOR ANXIETY FOR UP TO 30 DAYS  [provider]  [employer] ALLERGY RELIEF 25 MG capsule Take 25 mg by mouth every 6 (six) hours as needed. 01/25/22   [provider]  diazepam (VALIUM) 5 MG tablet TAKE 1 TABLET 1 HOUR BEFORE SURGERY    [provider]  doxycycline (VIBRAMYCIN) 100 MG capsule     [provider]  DULoxetine (CYMBALTA) 30 MG capsule Take by mouth. 01/23/21   [provider]  esomeprazole (NEXIUM) 40 MG capsule Take by mouth.    [provider]  EUCRISA 2 % OINT Apply     as directed QD/BID to AA's for eczema 10/28/19   [provider]  FLOVENT HFA 110 MCG/ACT inhaler SMARTSIG:1 Inhalation Via Inhaler Twice Daily 03/31/20   [provider]  fluticasone (FLONASE) 50 MCG/ACT nasal spray SPRAY TWO SPRAYS IN EACH NOSTRIL ONCE DAILY    [provider]  influenza vac split quadrivalent PF (FLUARIX) 0.5 ML injection TO BE ADMINISTERED BY PHARMACIST FOR IMMUNIZATION    [provider]  magic mouthwash (nystatin, lidocaine, diphenhydrAMINE) suspension Take 5 mLs by mouth 3 (three) times daily for 7 days. 06/19/22 06/26/22 Yes Rima Blizzard, Annie Main, FNP  meclizine (ANTIVERT) 12.5 MG tablet TAKE 1 TABLET BY MOUTH 3 TIMES DAILY AS NEEDED FOR DIZZINESS.    [provider]  methylPREDNISolone (MEDROL DOSEPAK) 4 MG TBPK tablet Take by mouth as directed. 04/16/22   [provider]  MIMVEY 1-0.5 MG tablet TAKE 1 TABLET BY MOUTH EVERY DAY 03/25/22   Harlin Heys, MD  mometasone furoate (ASMANEX, 30 METERED DOSES,) 110 MCG/ACT AEPB     [provider]  montelukast (SINGULAIR) 10 MG tablet Take 1 tablet by mouth daily.    [provider]  oxyCODONE-acetaminophen (PERCOCET/ROXICET) 5-325 MG tablet TAKE 1 EVERY 6 HOURS FOR POST OPERATION PAIN    [provider]  potassium gluconate 595 (99 K) MG TABS tablet Take by mouth.    [provider]  promethazine (PHENERGAN) 25 MG tablet TAKE 1 TABLET 1 HR. BEFORE SURGERY/TAKE 1 TABLET EVERY 4HOURS AS NEEDED FOR NAUSEA AND VOMITING    [provider]  tobramycin (TOBREX) 0.3 % ophthalmic solution PLACE 1 DROP IN INTO RIGHT EYE 4 TIMES A DAY FOR 7 DAYS    [provider]  triamterene-hydrochlorothiazide (MAXZIDE-25) 37.5-25 MG per tablet Take 1 tablet by mouth daily.     [provider]  valACYclovir (VALTREX) 1000 MG tablet 1,000 mg as needed.  07/07/14   [provider]  zolpidem (AMBIEN) 10 MG tablet Take 10 mg by mouth at bedtime as needed for sleep.    [provider]  Zoster Vaccine Adjuvanted [facility]) injection     [provider]    Family History Family History  Problem Relation Age of Onset   Hypertension Mother    Hypertension Father    Breast cancer Maternal Aunt 56   Colon cancer Paternal Grandmother    Diabetes Paternal Grandfather    Heart disease Neg Hx    Ovarian cancer Neg Hx     Social History Social  History   Tobacco Use   Smoking status: Never   Smokeless tobacco: Never  Vaping Use   Vaping Use: Never used  Substance Use Topics   Alcohol use: Yes    Alcohol/week: 1.0 standard drink of alcohol    Types: 1 Glasses of wine per week    Comment: 1 drink a day and more on weekedns   Drug use: No  Allergies   Clonazepam and Sulfa antibiotics   Review of Systems Review of Systems   Physical Exam Triage Vital Signs ED Triage Vitals [06/19/22 1137]  Enc Vitals Group     BP (!) 142/98     Pulse Rate 81     Resp 18     Temp 99.3 F (37.4 C)     Temp Source Oral     SpO2 98 %     Weight      Height      Head Circumference      Peak Flow      Pain Score 0     Pain Loc      Pain Edu?      Excl. in [place]?    No data found.  Updated Vital Signs BP (!) 142/98 (BP Location: Left Arm)   Pulse 81   Temp 99.3 F (37.4 C) (Oral)   Resp 18   LMP 08/24/2016 (Exact Date)   SpO2 98%   Visual Acuity Right Eye Distance:   Left Eye Distance:   Bilateral Distance:    Right Eye Near:   Left Eye Near:    Bilateral Near:     Physical Exam Vitals reviewed.  Constitutional:      Appearance: Normal appearance.  HENT:     Mouth/Throat:     Mouth: Oral lesions present.     Comments: Notable aphthous ulcers are present on her gums and oral mucosa. Musculoskeletal:       Hands:  Skin:    General: Skin is warm and dry.  Neurological:     General: No focal deficit present.     Mental Status: She is alert and oriented to person, place, and time.  Psychiatric:        Mood and Affect: Mood normal.        Behavior: Behavior normal.      UC Treatments / Results  Labs (all labs ordered are listed, but only abnormal results are displayed) Labs Reviewed - No data to display  EKG   Radiology No results found.  Procedures Procedures (including critical care time)  Medications Ordered in UC Medications - No data to display  Initial Impression / Assessment and  Plan / UC Course  I have reviewed the triage vital signs and the nursing notes.  Pertinent labs & imaging results that were available during my care of the patient were reviewed by me and considered in my medical decision making (see chart for details).   Aphthous ulcer oral mucosa.  Will treat with Magic mouthwash.  paronchia of left fourth finger.  There is no erythema or edema.  Mildly tender to palpation.  Appears in a state of healing.  Recommended twice daily soaks in warm water with Epsom salt.   Final Clinical Impressions(s) / UC Diagnoses   Final diagnoses:  Aphthous ulcer of mouth     Discharge Instructions      Soak your finger in a basin of very warm water with dissolved Epsom salts 3 times a day.  Dry and apply an antibacterial ointment.  Use the "magic mouthwash 3 times a day for your ulcers.  Swish and spit.   ED Prescriptions     Medication Sig Dispense Auth. Provider   magic mouthwash (nystatin, lidocaine, diphenhydrAMINE) suspension Take 5 mLs by mouth 3 (three) times daily for 7 days. 105 mL Dhwani Venkatesh, FNP      PDMP not reviewed this encounter.   Brydan Downard, Annie Main, FNP  06/19/22 1221  

## 2022-06-19 NOTE — Discharge Instructions (Addendum)
Soak your finger in a basin of very warm water with dissolved Epsom salts 3 times a day.  Dry and apply an antibacterial ointment.  Use the "magic mouthwash 3 times a day for your ulcers.  Swish and spit.  Follow-up here or in primary care if your symptoms worsen or do not resolve.

## 2022-06-28 ENCOUNTER — Ambulatory Visit: Payer: BC Managed Care – PPO | Admitting: Dermatology
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
## 2022-07-18 DIAGNOSIS — M1611 Unilateral primary osteoarthritis, right hip: Secondary | ICD-10-CM | POA: Insufficient documentation

## 2022-07-19 ENCOUNTER — Ambulatory Visit (INDEPENDENT_AMBULATORY_CARE_PROVIDER_SITE_OTHER): Payer: Self-pay | Admitting: Dermatology

## 2022-07-19 DIAGNOSIS — L988 Other specified disorders of the skin and subcutaneous tissue: Secondary | ICD-10-CM

## 2022-07-19 NOTE — Progress Notes (Signed)
   Follow-Up Visit   Subjective  Abigail Gross is a 61 y.o. female who presents for the following: Facial Elastosis (Patient here today for Botox injections. ).  The following portions of the chart were reviewed this encounter and updated as appropriate:   Tobacco  Allergies  Meds  Problems  Med Hx  Surg Hx  Fam Hx      Review of Systems:  No other skin or systemic complaints except as noted in HPI or Assessment and Plan.  Objective  Well appearing patient in no apparent distress; mood and affect are within normal limits.  A focused examination was performed including face. Relevant physical exam findings are noted in the Assessment and Plan.  face Rhytides and volume loss.        Assessment & Plan  Elastosis of skin face  Filling material injection - face Location: See attached image  Informed consent: Discussed risks (infection, pain, bleeding, bruising, swelling, allergic reaction, paralysis of nearby muscles, eyelid droop, double vision, neck weakness, difficulty breathing, headache, undesirable cosmetic result, and need for additional treatment) and benefits of the procedure, as well as the alternatives.  Informed consent was obtained.  Preparation: The area was cleansed with alcohol.  Procedure Details:  Botox was injected into the dermis with a 30-gauge needle. Pressure applied to any bleeding. Ice packs offered for swelling.  Lot Number:  [id number] Expiration:  08/2024  Total Units Injected:  67  Plan: Tylenol may be used for headache.  Allow 2 weeks before returning to clinic for additional dosing as needed. Patient will call for any problems.    Return in about 3 months (around 10/19/2022) for Botox.  Graciella Belton, RMA, am acting as scribe for Forest Gleason, MD .  Documentation: I have reviewed the above documentation for accuracy and completeness, and I agree with the above.  Forest Gleason, MD

## 2022-07-19 NOTE — Patient Instructions (Signed)
Due to recent changes in healthcare laws, you may see results of your pathology and/or laboratory studies on MyChart before the doctors have had a chance to review them. We understand that in some cases there may be results that are confusing or concerning to you. Please understand that not all results are received at the same time and often the doctors may need to interpret multiple results in order to provide you with the best plan of care or course of treatment. Therefore, we ask that you please give us 2 business days to thoroughly review all your results before contacting the office for clarification. Should we see a critical lab result, you will be contacted sooner.   If You Need Anything After Your Visit  If you have any questions or concerns for your doctor, please call our main line at [phone] and press option 4 to reach your doctor's medical assistant. If no one answers, please leave a voicemail as directed and we will return your call as soon as possible. Messages left after 4 pm will be answered the following business day.   You may also send us a message via MyChart. We typically respond to MyChart messages within 1-2 business days.  For prescription refills, please ask your pharmacy to contact our office. Our fax number is [phone].  If you have an urgent issue when the clinic is closed that cannot wait until the next business day, you can page your doctor at the number below.    Please note that while we do our best to be available for urgent issues outside of office hours, we are not available 24/7.   If you have an urgent issue and are unable to reach us, you may choose to seek medical care at your doctor's office, retail clinic, urgent care center, or emergency room.  If you have a medical emergency, please immediately call 911 or go to the emergency department.  Pager Numbers  - Dr. Kowalski: [phone]  - Dr. Moye: [phone]  - Dr. Stewart:  [phone]  In the event of inclement weather, please call our main line at [phone] for an update on the status of any delays or closures.  Dermatology Medication Tips: Please keep the boxes that topical medications come in in order to help keep track of the instructions about where and how to use these. Pharmacies typically print the medication instructions only on the boxes and not directly on the medication tubes.   If your medication is too expensive, please contact our office at [phone] option 4 or send us a message through MyChart.   We are unable to tell what your co-pay for medications will be in advance as this is different depending on your insurance coverage. However, we may be able to find a substitute medication at lower cost or fill out paperwork to get insurance to cover a needed medication.   If a prior authorization is required to get your medication covered by your insurance company, please allow us 1-2 business days to complete this process.  Drug prices often vary depending on where the prescription is filled and some pharmacies may offer cheaper prices.  The website www.goodrx.com contains coupons for medications through different pharmacies. The prices here do not account for what the cost may be with help from insurance (it may be cheaper with your insurance), but the website can give you the price if you did not use any insurance.  - You can print the associated coupon and take it with   your prescription to the pharmacy.  - You may also stop by our office during regular business hours and pick up a GoodRx coupon card.  - If you need your prescription sent electronically to a different pharmacy, notify our office through [place] MyChart or by phone at [phone] option 4.     Si Usted Necesita Algo Despus de Su Visita  Tambin puede enviarnos un mensaje a travs de MyChart. Por lo general respondemos a los mensajes de MyChart en el transcurso de 1 a 2  das hbiles.  Para renovar recetas, por favor pida a su farmacia que se ponga en contacto con nuestra oficina. Nuestro nmero de fax es el [phone].  Si tiene un asunto urgente cuando la clnica est cerrada y que no puede esperar hasta el siguiente da hbil, puede llamar/localizar a su doctor(a) al nmero que aparece a continuacin.   Por favor, tenga en cuenta que aunque hacemos todo lo posible para estar disponibles para asuntos urgentes fuera del horario de oficina, no estamos disponibles las 24 horas del da, los 7 das de la semana.   Si tiene un problema urgente y no puede comunicarse con nosotros, puede optar por buscar atencin mdica  en el consultorio de su doctor(a), en una clnica privada, en un centro de atencin urgente o en una sala de emergencias.  Si tiene una emergencia mdica, por favor llame inmediatamente al 911 o vaya a la sala de emergencias.  Nmeros de bper  - Dr. Kowalski: [phone]  - Dra. Moye: [phone]  - Dra. Stewart: [phone]  En caso de inclemencias del tiempo, por favor llame a nuestra lnea principal al [phone] para una actualizacin sobre el estado de cualquier retraso o cierre.  Consejos para la medicacin en dermatologa: Por favor, guarde las cajas en las que vienen los medicamentos de uso tpico para ayudarle a seguir las instrucciones sobre dnde y cmo usarlos. Las farmacias generalmente imprimen las instrucciones del medicamento slo en las cajas y no directamente en los tubos del medicamento.   Si su medicamento es muy caro, por favor, pngase en contacto con nuestra oficina llamando al [phone] y presione la opcin 4 o envenos un mensaje a travs de MyChart.   No podemos decirle cul ser su copago por los medicamentos por adelantado ya que esto es diferente dependiendo de la cobertura de su seguro. Sin embargo, es posible que podamos encontrar un medicamento sustituto a menor costo o llenar un formulario para que el  seguro cubra el medicamento que se considera necesario.   Si se requiere una autorizacin previa para que su compaa de seguros cubra su medicamento, por favor permtanos de 1 a 2 das hbiles para completar este proceso.  Los precios de los medicamentos varan con frecuencia dependiendo del lugar de dnde se surte la receta y alguna farmacias pueden ofrecer precios ms baratos.  El sitio web www.goodrx.com tiene cupones para medicamentos de diferentes farmacias. Los precios aqu no tienen en cuenta lo que podra costar con la ayuda del seguro (puede ser ms barato con su seguro), pero el sitio web puede darle el precio si no utiliz ningn seguro.  - Puede imprimir el cupn correspondiente y llevarlo con su receta a la farmacia.  - Tambin puede pasar por nuestra oficina durante el horario de atencin regular y recoger una tarjeta de cupones de GoodRx.  - Si necesita que su receta se enve electrnicamente a una farmacia diferente, informe a nuestra oficina a travs de MyChart de [place]   o por telfono llamando al [phone] y presione la opcin 4.  

## 2022-07-23 ENCOUNTER — Encounter: Payer: Self-pay | Admitting: Dermatology

## 2022-07-31 ENCOUNTER — Other Ambulatory Visit: Payer: Self-pay

## 2022-07-31 NOTE — Telephone Encounter (Signed)
Correct. No refills needed.

## 2022-08-06 DIAGNOSIS — R7303 Prediabetes: Secondary | ICD-10-CM | POA: Insufficient documentation

## 2022-09-08 ENCOUNTER — Other Ambulatory Visit: Payer: Self-pay | Admitting: Obstetrics and Gynecology

## 2022-09-11 NOTE — Telephone Encounter (Signed)
She stopped the meds in August, don't thinks she needs a refill.

## 2022-10-25 ENCOUNTER — Encounter: Payer: Self-pay | Admitting: Dermatology

## 2022-10-25 ENCOUNTER — Ambulatory Visit (INDEPENDENT_AMBULATORY_CARE_PROVIDER_SITE_OTHER): Payer: Self-pay | Admitting: Dermatology

## 2022-10-25 ENCOUNTER — Telehealth: Payer: Self-pay

## 2022-10-25 VITALS — BP 141/96 | HR 83

## 2022-10-25 DIAGNOSIS — L988 Other specified disorders of the skin and subcutaneous tissue: Secondary | ICD-10-CM

## 2022-10-25 DIAGNOSIS — K120 Recurrent oral aphthae: Secondary | ICD-10-CM

## 2022-10-25 MED ORDER — TRIAMCINOLONE ACETONIDE 0.1 % MT PSTE: PASTE | OROMUCOSAL | 5 refills | Status: DC

## 2022-10-25 NOTE — Progress Notes (Signed)
   Follow-Up Visit   Subjective  Abigail Gross is a 62 y.o. female who presents for the following: Facial Elastosis (Here for Botox).   The following portions of the chart were reviewed this encounter and updated as appropriate:  Tobacco  Allergies  Meds  Problems  Med Hx  Surg Hx  Fam Hx      Review of Systems: No other skin or systemic complaints except as noted in HPI or Assessment and Plan.   Objective  Well appearing patient in no apparent distress; mood and affect are within normal limits.  A focused examination was performed including face. Relevant physical exam findings are noted in the Assessment and Plan.  face Rhytides and volume loss.       Assessment & Plan  Elastosis of skin face  Botox 69 units.  Discussed filler. Plan mid face filler and lip filler next available.  Arrive 1 hour prior to appointment for numbing lips.   Start Valacyclovir 24 hours prior to appointment for lip filler.  Take Valtrex 1gm 1/2 tablet twice daily for 8 days    Botox Injection - face Location: See attached image  Informed consent: Discussed risks (infection, pain, bleeding, bruising, swelling, allergic reaction, paralysis of nearby muscles, eyelid droop, double vision, neck weakness, difficulty breathing, headache, undesirable cosmetic result, and need for additional treatment) and benefits of the procedure, as well as the alternatives.  Informed consent was obtained.  Preparation: The area was cleansed with alcohol.  Procedure Details:  Botox was injected into the dermis with a 30-gauge needle. Pressure applied to any bleeding. Ice packs offered for swelling.  Lot Number:  [id number] Expiration:  11/2024  Total Units Injected:  69  Plan: Tylenol may be used for headache.  Allow 2 weeks before returning to clinic for additional dosing as needed. Patient will call for any problems.    Return in about 3 months (around 01/23/2023) for Botox, Filler mid face and lips,  next available.  I, Emelia Salisbury, CMA, am acting as scribe for Forest Gleason, MD.  Documentation: I have reviewed the above documentation for accuracy and completeness, and I agree with the above.  Forest Gleason, MD

## 2022-10-25 NOTE — Patient Instructions (Addendum)
Start Valacyclovir 24 hours prior to appointment for lip filler, 5 days prior to face filler. Take Valtrex 1gm 1/2 tablet twice daily for 8 days. Call if need refills.  Make sure toothpaste is sodium lauryl sulfate free (SLS)  Triamcinolone dental paste 1-2 times daily to dry ulcer.  Use Arnica 3-4 times daily for bruising.   Due to recent changes in healthcare laws, you may see results of your pathology and/or laboratory studies on MyChart before the doctors have had a chance to review them. We understand that in some cases there may be results that are confusing or concerning to you. Please understand that not all results are received at the same time and often the doctors may need to interpret multiple results in order to provide you with the best plan of care or course of treatment. Therefore, we ask that you please give [place] 2 business days to thoroughly review all your results before contacting the office for clarification. Should we see a critical lab result, you will be contacted sooner.   If You Need Anything After Your Visit  If you have any questions or concerns for your doctor, please call our main line at [phone] and press option 4 to reach your doctor's medical assistant. If no one answers, please leave a voicemail as directed and we will return your call as soon as possible. Messages left after 4 pm will be answered the following business day.   You may also send [place] a message via [place]. We typically respond to MyChart messages within 1-2 business days.  For prescription refills, please ask your pharmacy to contact our office. Our fax number is [phone].  If you have an urgent issue when the clinic is closed that cannot wait until the next business day, you can page your doctor at the number below.    Please note that while we do our best to be available for urgent issues outside of office hours, we are not available 24/7.   If you have an urgent issue and are unable to  reach [place], you may choose to seek medical care at your doctor's office, retail clinic, urgent care center, or emergency room.  If you have a medical emergency, please immediately call 911 or go to the emergency department.  Pager Numbers  - Dr. Nehemiah Massed: [phone]  - Dr. Laurence Ferrari: [phone]  - Dr. Nicole Kindred: [phone]  In the event of inclement weather, please call our main line at [phone] for an update on the status of any delays or closures.  Dermatology Medication Tips: Please keep the boxes that topical medications come in in order to help keep track of the instructions about where and how to use these. Pharmacies typically print the medication instructions only on the boxes and not directly on the medication tubes.   If your medication is too expensive, please contact our office at [phone] option 4 or send [place] a message through [place].   We are unable to tell what your co-pay for medications will be in advance as this is different depending on your insurance coverage. However, we may be able to find a substitute medication at lower cost or fill out paperwork to get insurance to cover a needed medication.   If a prior authorization is required to get your medication covered by your insurance company, please allow [place] 1-2 business days to complete this process.  Drug prices often vary depending on where the prescription is filled and some pharmacies may offer cheaper prices.  The website www.goodrx.com contains coupons for medications through different pharmacies. The prices here do not account for what the cost may be with help from insurance (it may be cheaper with your insurance), but the website can give you the price if you did not use any insurance.  - You can print the associated coupon and take it with your prescription to the pharmacy.  - You may also stop by our office during regular business hours and pick up a GoodRx coupon card.  - If you need your prescription  sent electronically to a different pharmacy, notify our office through [facility] or by phone at [phone] option 4.     Si Usted Necesita Algo Despus de Su Visita  Tambin puede enviarnos un mensaje a travs de Pharmacist, community. Por lo general respondemos a los mensajes de MyChart en el transcurso de 1 a 2 das hbiles.  Para renovar recetas, por favor pida a su farmacia que se ponga en contacto con nuestra oficina. Harland Dingwall de fax es [place] [phone].  Si tiene un asunto urgente cuando la clnica est cerrada y que no puede esperar hasta el siguiente da hbil, puede llamar/localizar a su doctor(a) al nmero que aparece a continuacin.   Por favor, tenga en cuenta que aunque hacemos todo lo posible para estar disponibles para asuntos urgentes fuera del horario de [place], no estamos disponibles las 24 horas del da, los 7 das de la [place].   Si tiene un problema urgente y no puede comunicarse con nosotros, puede optar por buscar atencin mdica  en el consultorio de su doctor(a), en una clnica privada, en un centro de atencin urgente o en una sala de emergencias.  Si tiene Engineering geologist, por favor llame inmediatamente al 911 o vaya a la sala de emergencias.  Nmeros de bper  - Dr. Nehemiah Massed: [phone]  - Dra. Moye: [phone]  - Dra. Nicole Kindred: [phone]  En caso de inclemencias del [place], por favor llame a Johnsie Kindred principal al [phone] para una actualizacin sobre el [place] de cualquier retraso o cierre.  Consejos para la medicacin en dermatologa: Por favor, guarde las cajas en las que vienen los medicamentos de uso tpico para ayudarle a seguir las instrucciones sobre dnde y cmo usarlos. Las farmacias generalmente imprimen las instrucciones del medicamento slo en las cajas y no directamente en los tubos del [place].   Si su medicamento es muy caro, por favor, pngase en contacto con Zigmund Daniel llamando al [phone] y presione  la opcin 4 o envenos un mensaje a travs de Pharmacist, community.   No podemos decirle cul ser su copago por los medicamentos por adelantado ya que esto es diferente dependiendo de la cobertura de su seguro. Sin embargo, es posible que podamos encontrar un medicamento sustituto a Electrical engineer un formulario para que el seguro cubra el medicamento que se considera necesario.   Si se requiere una autorizacin previa para que su compaa de seguros [place] su medicamento, por favor permtanos de 1 a 2 das hbiles para completar este proceso.  Los precios de los medicamentos varan con frecuencia dependiendo del Environmental consultant de dnde se surte la receta y alguna farmacias pueden ofrecer precios ms baratos.  El sitio web www.goodrx.com tiene cupones para medicamentos de Airline pilot. Los precios aqu no tienen en cuenta lo que podra costar con la ayuda del seguro (puede ser ms barato con su seguro), pero el sitio web puede darle el precio si no utiliz Research scientist (physical sciences).  -  Puede imprimir el cupn correspondiente y llevarlo con su receta a la farmacia.  - Tambin puede pasar por nuestra oficina durante el horario de atencin regular y Charity fundraiser una tarjeta de cupones de GoodRx.  - Si necesita que su receta se enve electrnicamente a una farmacia diferente, informe a nuestra oficina a travs de MyChart de [place] o por telfono llamando al [phone] y presione la opcin 4.

## 2022-10-25 NOTE — Telephone Encounter (Signed)
Called requesting Refill of Triamcinolone dental paste. Rf sent to pharmacy.

## 2022-10-31 ENCOUNTER — Ambulatory Visit: Payer: BC Managed Care – PPO

## 2022-10-31 ENCOUNTER — Ambulatory Visit (INDEPENDENT_AMBULATORY_CARE_PROVIDER_SITE_OTHER): Payer: BC Managed Care – PPO | Admitting: Dermatology

## 2022-10-31 ENCOUNTER — Encounter: Payer: Self-pay | Admitting: Dermatology

## 2022-10-31 VITALS — BP 152/103 | HR 63

## 2022-10-31 DIAGNOSIS — L988 Other specified disorders of the skin and subcutaneous tissue: Secondary | ICD-10-CM

## 2022-10-31 NOTE — Patient Instructions (Addendum)
Due to recent changes in healthcare laws, you may see results of your pathology and/or laboratory studies on MyChart before the doctors have had a chance to review them. We understand that in some cases there may be results that are confusing or concerning to you. Please understand that not all results are received at the same time and often the doctors may need to interpret multiple results in order to provide you with the best plan of care or course of treatment. Therefore, we ask that you please give us 2 business days to thoroughly review all your results before contacting the office for clarification. Should we see a critical lab result, you will be contacted sooner.   If You Need Anything After Your Visit  If you have any questions or concerns for your doctor, please call our main line at [phone] and press option 4 to reach your doctor's medical assistant. If no one answers, please leave a voicemail as directed and we will return your call as soon as possible. Messages left after 4 pm will be answered the following business day.   You may also send us a message via MyChart. We typically respond to MyChart messages within 1-2 business days.  For prescription refills, please ask your pharmacy to contact our office. Our fax number is [phone].  If you have an urgent issue when the clinic is closed that cannot wait until the next business day, you can page your doctor at the number below.    Please note that while we do our best to be available for urgent issues outside of office hours, we are not available 24/7.   If you have an urgent issue and are unable to reach us, you may choose to seek medical care at your doctor's office, retail clinic, urgent care center, or emergency room.  If you have a medical emergency, please immediately call 911 or go to the emergency department.  Pager Numbers  - Dr. Kowalski: [phone]  - Dr. Moye: [phone]  - Dr. Stewart:  [phone]  In the event of inclement weather, please call our main line at [phone] for an update on the status of any delays or closures.  Dermatology Medication Tips: Please keep the boxes that topical medications come in in order to help keep track of the instructions about where and how to use these. Pharmacies typically print the medication instructions only on the boxes and not directly on the medication tubes.   If your medication is too expensive, please contact our office at [phone] option 4 or send us a message through MyChart.   We are unable to tell what your co-pay for medications will be in advance as this is different depending on your insurance coverage. However, we may be able to find a substitute medication at lower cost or fill out paperwork to get insurance to cover a needed medication.   If a prior authorization is required to get your medication covered by your insurance company, please allow us 1-2 business days to complete this process.  Drug prices often vary depending on where the prescription is filled and some pharmacies may offer cheaper prices.  The website www.goodrx.com contains coupons for medications through different pharmacies. The prices here do not account for what the cost may be with help from insurance (it may be cheaper with your insurance), but the website can give you the price if you did not use any insurance.  - You can print the associated coupon and take it with   your prescription to the pharmacy.  - You may also stop by our office during regular business hours and pick up a GoodRx coupon card.  - If you need your prescription sent electronically to a different pharmacy, notify our office through [facility] MyChart or by phone at [phone] option 4.     Si Usted Necesita Algo Despus de Su Visita  Tambin puede enviarnos un mensaje a travs de MyChart. Por lo general respondemos a los mensajes de MyChart en el transcurso de 1 a 2  das hbiles.  Para renovar recetas, por favor pida a su farmacia que se ponga en contacto con nuestra oficina. Nuestro nmero de fax es el [phone].  Si tiene un asunto urgente cuando la clnica est cerrada y que no puede esperar hasta el siguiente da hbil, puede llamar/localizar a su doctor(a) al nmero que aparece a continuacin.   Por favor, tenga en cuenta que aunque hacemos todo lo posible para estar disponibles para asuntos urgentes fuera del horario de oficina, no estamos disponibles las 24 horas del da, los 7 das de la semana.   Si tiene un problema urgente y no puede comunicarse con nosotros, puede optar por buscar atencin mdica  en el consultorio de su doctor(a), en una clnica privada, en un centro de atencin urgente o en una sala de emergencias.  Si tiene una emergencia mdica, por favor llame inmediatamente al 911 o vaya a la sala de emergencias.  Nmeros de bper  - Dr. Kowalski: [phone]  - Dra. Moye: [phone]  - Dra. Stewart: [phone]  En caso de inclemencias del tiempo, por favor llame a nuestra lnea principal al [phone] para una actualizacin sobre el estado de cualquier retraso o cierre.  Consejos para la medicacin en dermatologa: Por favor, guarde las cajas en las que vienen los medicamentos de uso tpico para ayudarle a seguir las instrucciones sobre dnde y cmo usarlos. Las farmacias generalmente imprimen las instrucciones del medicamento slo en las cajas y no directamente en los tubos del medicamento.   Si su medicamento es muy caro, por favor, pngase en contacto con nuestra oficina llamando al [phone] y presione la opcin 4 o envenos un mensaje a travs de MyChart.   No podemos decirle cul ser su copago por los medicamentos por adelantado ya que esto es diferente dependiendo de la cobertura de su seguro. Sin embargo, es posible que podamos encontrar un medicamento sustituto a menor costo o llenar un formulario para que el  seguro cubra el medicamento que se considera necesario.   Si se requiere una autorizacin previa para que su compaa de seguros cubra su medicamento, por favor permtanos de 1 a 2 das hbiles para completar este proceso.  Los precios de los medicamentos varan con frecuencia dependiendo del lugar de dnde se surte la receta y alguna farmacias pueden ofrecer precios ms baratos.  El sitio web www.goodrx.com tiene cupones para medicamentos de diferentes farmacias. Los precios aqu no tienen en cuenta lo que podra costar con la ayuda del seguro (puede ser ms barato con su seguro), pero el sitio web puede darle el precio si no utiliz ningn seguro.  - Puede imprimir el cupn correspondiente y llevarlo con su receta a la farmacia.  - Tambin puede pasar por nuestra oficina durante el horario de atencin regular y recoger una tarjeta de cupones de GoodRx.  - Si necesita que su receta se enve electrnicamente a una farmacia diferente, informe a nuestra oficina a travs de MyChart de [facility]   o por telfono llamando al [phone] y presione la opcin 4.  

## 2022-10-31 NOTE — Progress Notes (Unsigned)
   Follow-Up Visit   Subjective  Abigail Gross is a 62 y.o. 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 ).  The following portions of the chart were reviewed this encounter and updated as appropriate:  Tobacco  Allergies  Meds  Problems  Med Hx  Surg Hx  Fam Hx      Review of Systems: No other skin or systemic complaints except as noted in HPI or Assessment and Plan.   Objective  Well appearing patient in no apparent distress; mood and affect are within normal limits.  A focused examination was performed including face. Relevant physical exam findings are noted in the Assessment and Plan.  Head - Anterior (Face) Rhytides and volume loss.                               Assessment & Plan  Elastosis of skin Head - Anterior (Face)   Lip filler today  Voluma XC 1.0 ml  Lot [id number] 06/02/2023  Mid face filler  Restylane Refyne Lot [id number] Exp 10/25/2023   Filling material injection - Head - Anterior (Face) Prior to the procedure, the patient's past medical history, allergies and the rare but potential risks and complications were reviewed with the patient and a signed consent was obtained. Pre and post-treatment care was discussed and instructions provided.  Risks including vascular occlusion were discussed.   Location: See attached photo  Filler Type: Juvederm Voluma  Procedure: Lidocaine-tetracaine ointment was applied to treatment areas to achieve good local anesthesia. The area was prepped thoroughly with Puracyn. After introducing the needle into the desired treatment area, the syringe plunger was drawn back to ensure there was no flash of blood prior to injecting the filler in order to minimize risk of intravascular injection and vascular occlusion. After injection of the filler, the treated areas were cleansed and iced to reduce swelling. Post-treatment instructions were  reviewed with the patient.       Patient tolerated the procedure well. The patient will call with any problems, questions or concerns prior to their next appointment.   Filling material injection - Head - Anterior (Face) Prior to the procedure, the patient's past medical history, allergies and the rare but potential risks and complications were reviewed with the patient and a signed consent was obtained. Pre and post-treatment care was discussed and instructions provided.  Risks including vascular occlusion were discussed.   Location: See attached photo  Filler Type: Restylane refyne  Procedure: Lidocaine-tetracaine ointment was applied to treatment areas to achieve good local anesthesia. The area was prepped thoroughly with Puracyn. After introducing the needle into the desired treatment area, the syringe plunger was drawn back to ensure there was no flash of blood prior to injecting the filler in order to minimize risk of intravascular injection and vascular occlusion. After injection of the filler, the treated areas were cleansed and iced to reduce swelling. Post-treatment instructions were reviewed with the patient.       Patient tolerated the procedure well. The patient will call with any problems, questions or concerns prior to their next appointment.    Return for 1 week follow up on filler.  I, Ruthell Rummage, CMA, am acting as scribe for Forest Gleason, MD.  Documentation: I have reviewed the above documentation for accuracy and completeness, and I agree with the above.  Forest Gleason, MD

## 2022-11-01 ENCOUNTER — Encounter: Payer: Self-pay | Admitting: Dermatology

## 2022-11-06 ENCOUNTER — Encounter: Payer: Self-pay | Admitting: Dermatology

## 2022-11-07 ENCOUNTER — Ambulatory Visit: Payer: BC Managed Care – PPO | Admitting: Dermatology

## 2022-11-22 ENCOUNTER — Ambulatory Visit: Payer: BC Managed Care – PPO | Admitting: Dermatology

## 2022-12-20 ENCOUNTER — Ambulatory Visit: Payer: BC Managed Care – PPO | Admitting: Dermatology

## 2023-01-10 ENCOUNTER — Ambulatory Visit: Payer: BC Managed Care – PPO | Admitting: Dermatology

## 2023-01-24 ENCOUNTER — Encounter: Payer: Self-pay | Admitting: Dermatology

## 2023-01-24 ENCOUNTER — Ambulatory Visit (INDEPENDENT_AMBULATORY_CARE_PROVIDER_SITE_OTHER): Payer: Self-pay | Admitting: Dermatology

## 2023-01-24 DIAGNOSIS — L988 Other specified disorders of the skin and subcutaneous tissue: Secondary | ICD-10-CM

## 2023-01-24 NOTE — Patient Instructions (Signed)
Due to recent changes in healthcare laws, you may see results of your pathology and/or laboratory studies on MyChart before the doctors have had a chance to review them. We understand that in some cases there may be results that are confusing or concerning to you. Please understand that not all results are received at the same time and often the doctors may need to interpret multiple results in order to provide you with the best plan of care or course of treatment. Therefore, we ask that you please give us 2 business days to thoroughly review all your results before contacting the office for clarification. Should we see a critical lab result, you will be contacted sooner.   If You Need Anything After Your Visit  If you have any questions or concerns for your doctor, please call our main line at [phone] and press option 4 to reach your doctor's medical assistant. If no one answers, please leave a voicemail as directed and we will return your call as soon as possible. Messages left after 4 pm will be answered the following business day.   You may also send us a message via MyChart. We typically respond to MyChart messages within 1-2 business days.  For prescription refills, please ask your pharmacy to contact our office. Our fax number is [phone].  If you have an urgent issue when the clinic is closed that cannot wait until the next business day, you can page your doctor at the number below.    Please note that while we do our best to be available for urgent issues outside of office hours, we are not available 24/7.   If you have an urgent issue and are unable to reach us, you may choose to seek medical care at your doctor's office, retail clinic, urgent care center, or emergency room.  If you have a medical emergency, please immediately call 911 or go to the emergency department.  Pager Numbers  - Dr. Kowalski: [phone]  - Dr. Moye: [phone]  - Dr. Stewart:  [phone]  In the event of inclement weather, please call our main line at [phone] for an update on the status of any delays or closures.  Dermatology Medication Tips: Please keep the boxes that topical medications come in in order to help keep track of the instructions about where and how to use these. Pharmacies typically print the medication instructions only on the boxes and not directly on the medication tubes.   If your medication is too expensive, please contact our office at [phone] option 4 or send us a message through MyChart.   We are unable to tell what your co-pay for medications will be in advance as this is different depending on your insurance coverage. However, we may be able to find a substitute medication at lower cost or fill out paperwork to get insurance to cover a needed medication.   If a prior authorization is required to get your medication covered by your insurance company, please allow us 1-2 business days to complete this process.  Drug prices often vary depending on where the prescription is filled and some pharmacies may offer cheaper prices.  The website www.goodrx.com contains coupons for medications through different pharmacies. The prices here do not account for what the cost may be with help from insurance (it may be cheaper with your insurance), but the website can give you the price if you did not use any insurance.  - You can print the associated coupon and take it with   your prescription to the pharmacy.  - You may also stop by our office during regular business hours and pick up a GoodRx coupon card.  - If you need your prescription sent electronically to a different pharmacy, notify our office through [facility] MyChart or by phone at [phone] option 4.     Si Usted Necesita Algo Despus de Su Visita  Tambin puede enviarnos un mensaje a travs de MyChart. Por lo general respondemos a los mensajes de MyChart en el transcurso de 1 a 2  das hbiles.  Para renovar recetas, por favor pida a su farmacia que se ponga en contacto con nuestra oficina. Nuestro nmero de fax es el [phone].  Si tiene un asunto urgente cuando la clnica est cerrada y que no puede esperar hasta el siguiente da hbil, puede llamar/localizar a su doctor(a) al nmero que aparece a continuacin.   Por favor, tenga en cuenta que aunque hacemos todo lo posible para estar disponibles para asuntos urgentes fuera del horario de oficina, no estamos disponibles las 24 horas del da, los 7 das de la semana.   Si tiene un problema urgente y no puede comunicarse con nosotros, puede optar por buscar atencin mdica  en el consultorio de su doctor(a), en una clnica privada, en un centro de atencin urgente o en una sala de emergencias.  Si tiene una emergencia mdica, por favor llame inmediatamente al 911 o vaya a la sala de emergencias.  Nmeros de bper  - Dr. Kowalski: [phone]  - Dra. Moye: [phone]  - Dra. Stewart: [phone]  En caso de inclemencias del tiempo, por favor llame a nuestra lnea principal al [phone] para una actualizacin sobre el estado de cualquier retraso o cierre.  Consejos para la medicacin en dermatologa: Por favor, guarde las cajas en las que vienen los medicamentos de uso tpico para ayudarle a seguir las instrucciones sobre dnde y cmo usarlos. Las farmacias generalmente imprimen las instrucciones del medicamento slo en las cajas y no directamente en los tubos del medicamento.   Si su medicamento es muy caro, por favor, pngase en contacto con nuestra oficina llamando al [phone] y presione la opcin 4 o envenos un mensaje a travs de MyChart.   No podemos decirle cul ser su copago por los medicamentos por adelantado ya que esto es diferente dependiendo de la cobertura de su seguro. Sin embargo, es posible que podamos encontrar un medicamento sustituto a menor costo o llenar un formulario para que el  seguro cubra el medicamento que se considera necesario.   Si se requiere una autorizacin previa para que su compaa de seguros cubra su medicamento, por favor permtanos de 1 a 2 das hbiles para completar este proceso.  Los precios de los medicamentos varan con frecuencia dependiendo del lugar de dnde se surte la receta y alguna farmacias pueden ofrecer precios ms baratos.  El sitio web www.goodrx.com tiene cupones para medicamentos de diferentes farmacias. Los precios aqu no tienen en cuenta lo que podra costar con la ayuda del seguro (puede ser ms barato con su seguro), pero el sitio web puede darle el precio si no utiliz ningn seguro.  - Puede imprimir el cupn correspondiente y llevarlo con su receta a la farmacia.  - Tambin puede pasar por nuestra oficina durante el horario de atencin regular y recoger una tarjeta de cupones de GoodRx.  - Si necesita que su receta se enve electrnicamente a una farmacia diferente, informe a nuestra oficina a travs de MyChart de [facility]   o por telfono llamando al [phone] y presione la opcin 4.  

## 2023-01-24 NOTE — Progress Notes (Signed)
   Follow-Up Visit   Subjective  ADELYNNE JOERGER is a 62 y.o. female who presents for the following: Botox for facial elastosis  The following portions of the chart were reviewed this encounter and updated as appropriate: medications, allergies, medical history  Review of Systems:  No other skin or systemic complaints except as noted in HPI or Assessment and Plan.  Objective  Well appearing patient in no apparent distress; mood and affect are within normal limits.  A focused examination was performed of the face.  Relevant physical exam findings are noted in the Assessment and Plan.  Injection map photo     Assessment & Plan    Facial Elastosis  Location: See attached image  Informed consent: Discussed risks (infection, pain, bleeding, bruising, swelling, allergic reaction, paralysis of nearby muscles, eyelid droop, double vision, neck weakness, difficulty breathing, headache, undesirable cosmetic result, and need for additional treatment) and benefits of the procedure, as well as the alternatives.  Informed consent was obtained.  Preparation: The area was cleansed with alcohol.  Procedure Details:  Botox was injected into the dermis with a 30-gauge needle. Pressure applied to any bleeding. Ice packs offered for swelling.  Lot Number:  [id number] Expiration:  12/2024  Total Units Injected:  77  Plan: Tylenol may be used for headache.  Allow 2 weeks before returning to clinic for additional dosing as needed. Patient will call for any problems.  Return in about 3 months (around 04/26/2023) for Botox.  I, Lawson Radar, CMA, am acting as scribe for Darden Dates, MD.   Documentation: I have reviewed the above documentation for accuracy and completeness, and I agree with the above.  Darden Dates, MD

## 2023-02-12 ENCOUNTER — Ambulatory Visit: Payer: BC Managed Care – PPO | Admitting: Dermatology

## 2023-04-23 ENCOUNTER — Ambulatory Visit (INDEPENDENT_AMBULATORY_CARE_PROVIDER_SITE_OTHER): Payer: Self-pay | Admitting: Dermatology

## 2023-04-23 ENCOUNTER — Encounter: Payer: Self-pay | Admitting: Dermatology

## 2023-04-23 DIAGNOSIS — L988 Other specified disorders of the skin and subcutaneous tissue: Secondary | ICD-10-CM

## 2023-04-23 NOTE — Progress Notes (Signed)
   Follow-Up Visit   Subjective  Abigail Gross is a 62 y.o. female who presents for the following: Botox for facial elastosis  The following portions of the chart were reviewed this encounter and updated as appropriate: medications, allergies, medical history  Review of Systems:  No other skin or systemic complaints except as noted in HPI or Assessment and Plan.  Objective  Well appearing patient in no apparent distress; mood and affect are within normal limits.  A focused examination was performed of the face.  Relevant physical exam findings are noted in the Assessment and Plan.  Injection map photo    Assessment & Plan    Facial Elastosis Botox 74 units injected today to: - Frown complex 20 units - Lat Brow lift 2.5 units x 2 (decreased from previous pattern of 4 units each side) - Forehead 6 units - Crows Feet 10 units x 2 - Bunny Lines 1 unit x 2 - Lateral Lip flip 4 units x 2 (4 units upper lip, 4 units lower lip) -DAO's 4 units x 2 - Chin 5 units  Location: frown complex, lat brow lift, forehead, crow's feet, bunny lines, lateral lip flip, DAO's, chin  Informed consent: Discussed risks (infection, pain, bleeding, bruising, swelling, allergic reaction, paralysis of nearby muscles, eyelid droop, double vision, neck weakness, difficulty breathing, headache, undesirable cosmetic result, and need for additional treatment) and benefits of the procedure, as well as the alternatives.  Informed consent was obtained.  Preparation: The area was cleansed with alcohol.  Procedure Details:  Botox was injected into the dermis with a 30-gauge needle. Pressure applied to any bleeding. Ice packs offered for swelling.  Lot Number:  [id number] Expiration:  06/26  Total Units Injected:  74  Plan: Tylenol may be used for headache.  Allow 2 weeks before returning to clinic for additional dosing as needed. Patient will call for any problems.  Return in 1 month (on 05/24/2023) for 1  month lip flip botox, 62m Botox full face.  I, Ardis Rowan, RMA, am acting as scribe for Armida Sans, MD .  Documentation: I have reviewed the above documentation for accuracy and completeness, and I agree with the above.  Armida Sans, MD

## 2023-04-23 NOTE — Patient Instructions (Signed)

## 2023-05-21 ENCOUNTER — Ambulatory Visit (INDEPENDENT_AMBULATORY_CARE_PROVIDER_SITE_OTHER): Payer: Self-pay | Admitting: Dermatology

## 2023-05-21 DIAGNOSIS — L988 Other specified disorders of the skin and subcutaneous tissue: Secondary | ICD-10-CM

## 2023-05-21 NOTE — Progress Notes (Signed)
   Follow-Up Visit   Subjective  Abigail Gross is a 62 y.o. female who presents for the following: Botox for facial elastosis  The following portions of the chart were reviewed this encounter and updated as appropriate: medications, allergies, medical history  Review of Systems:  No other skin or systemic complaints except as noted in HPI or Assessment and Plan.  Objective  Well appearing patient in no apparent distress; mood and affect are within normal limits.  A focused examination was performed of the face.  Relevant physical exam findings are noted in the Assessment and Plan.      Assessment & Plan   Botox 8 units injected as marked: - Upper lip 4 units - L lower lip 4 units  Facial Elastosis - patient has a lip flip injected every month because it wears off around a month's time.   Location: See attached image  Informed consent: Discussed risks (infection, pain, bleeding, bruising, swelling, allergic reaction, paralysis of nearby muscles, eyelid droop, double vision, neck weakness, difficulty breathing, headache, undesirable cosmetic result, and need for additional treatment) and benefits of the procedure, as well as the alternatives.  Informed consent was obtained.  Preparation: The area was cleansed with alcohol.  Procedure Details:  Botox was injected into the dermis with a 30-gauge needle. Pressure applied to any bleeding. Ice packs offered for swelling.  Lot Number:  [id number] Expiration:  05/2025  Total Units Injected:  8  Plan: Tylenol may be used for headache.  Allow 2 weeks before returning to clinic for additional dosing as needed. Patient will call for any problems.  Return in about 1 month (around 06/21/2023) for lip flip .  Maylene Roes, CMA, am acting as scribe for Armida Sans, MD .  Documentation: I have reviewed the above documentation for accuracy and completeness, and I agree with the above.  Armida Sans, MD

## 2023-05-21 NOTE — Patient Instructions (Signed)

## 2023-05-24 ENCOUNTER — Encounter: Payer: Self-pay | Admitting: Dermatology

## 2023-06-03 ENCOUNTER — Other Ambulatory Visit: Payer: Self-pay | Admitting: Obstetrics and Gynecology

## 2023-06-03 DIAGNOSIS — Z1231 Encounter for screening mammogram for malignant neoplasm of breast: Secondary | ICD-10-CM

## 2023-06-17 ENCOUNTER — Ambulatory Visit
Admission: RE | Admit: 2023-06-17 | Discharge: 2023-06-17 | Disposition: A | Discharge status: Home / Self Care | Payer: BC Managed Care – PPO | Source: Ambulatory Visit | Attending: Obstetrics and Gynecology | Admitting: Obstetrics and Gynecology

## 2023-06-17 DIAGNOSIS — Z1231 Encounter for screening mammogram for malignant neoplasm of breast: Secondary | ICD-10-CM | POA: Insufficient documentation

## 2023-06-18 ENCOUNTER — Ambulatory Visit (INDEPENDENT_AMBULATORY_CARE_PROVIDER_SITE_OTHER): Payer: Self-pay | Admitting: Dermatology

## 2023-06-18 VITALS — BP 142/91 | HR 87

## 2023-06-18 DIAGNOSIS — L988 Other specified disorders of the skin and subcutaneous tissue: Secondary | ICD-10-CM

## 2023-06-18 NOTE — Patient Instructions (Signed)

## 2023-06-18 NOTE — Progress Notes (Unsigned)
   Follow-Up Visit   Subjective  Abigail Gross is a 62 y.o. female who presents for the following: Botox for facial elastosis patient here for botox follow up and to discuss fillers.   The following portions of the chart were reviewed this encounter and updated as appropriate: medications, allergies, medical history  Review of Systems:  No other skin or systemic complaints except as noted in HPI or Assessment and Plan.  Objective  Well appearing patient in no apparent distress; mood and affect are within normal limits.  A focused examination was performed of the face.  Relevant physical exam findings are noted in the Assessment and Plan.  Injection map photo   Before filler       After Filler         Assessment & Plan     Facial Elastosis  Location: See attached image  Informed consent: Discussed risks (infection, pain, bleeding, bruising, swelling, allergic reaction, paralysis of nearby muscles, eyelid droop, double vision, neck weakness, difficulty breathing, headache, undesirable cosmetic result, and need for additional treatment) and benefits of the procedure, as well as the alternatives.  Informed consent was obtained.  Preparation: The area was cleansed with alcohol.  Procedure Details:  Botox was injected into the dermis with a 30-gauge needle. Pressure applied to any bleeding. Ice packs offered for swelling.  Lot Number:  [id number] Expiration:  05/25/2025  Total Units Injected:  8 units   Plan: Tylenol may be used for headache.  Allow 2 weeks before returning to clinic for additional dosing as needed. Patient will call for any problems.   Prior to the procedure, the patient's past medical history, allergies and the rare but potential risks and complications were reviewed with the patient and a signed consent was obtained. Pre and post-treatment care was discussed and instructions provided.  Location: anterior chin crease, lateral chin, oral  commissures  Filler Type: Restylane refyne  Procedure: The area was prepped thoroughly with Puracyn. After introducing the needle into the desired treatment area, the syringe plunger was drawn back to ensure there was no flash of blood prior to injecting the filler in order to minimize risk of intravascular injection and vascular occlusion. After injection of the filler, the treated areas were cleansed and iced to reduce swelling. Post-treatment instructions were reviewed with the patient.       Patient tolerated the procedure well. The patient will call with any problems, questions or concerns prior to their next appointment.    Return for schedule for tbse anytime , 6 month filler follow up.  IAsher Muir, CMA, am acting as scribe for Armida Sans, MD.   Documentation: I have reviewed the above documentation for accuracy and completeness, and I agree with the above.  Armida Sans, MD

## 2023-06-19 ENCOUNTER — Encounter: Payer: Self-pay | Admitting: Dermatology

## 2023-06-25 ENCOUNTER — Ambulatory Visit: Payer: BC Managed Care – PPO | Admitting: Dermatology

## 2023-07-23 ENCOUNTER — Encounter: Payer: Self-pay | Admitting: Dermatology

## 2023-07-23 ENCOUNTER — Ambulatory Visit (INDEPENDENT_AMBULATORY_CARE_PROVIDER_SITE_OTHER): Payer: BC Managed Care – PPO | Admitting: Dermatology

## 2023-07-23 DIAGNOSIS — D492 Neoplasm of unspecified behavior of bone, soft tissue, and skin: Secondary | ICD-10-CM | POA: Diagnosis not present

## 2023-07-23 DIAGNOSIS — Z7189 Other specified counseling: Secondary | ICD-10-CM

## 2023-07-23 DIAGNOSIS — L988 Other specified disorders of the skin and subcutaneous tissue: Secondary | ICD-10-CM

## 2023-07-23 NOTE — Patient Instructions (Signed)

## 2023-07-23 NOTE — Progress Notes (Signed)
   Follow-Up Visit   Subjective  Abigail Gross is a 62 y.o. female who presents for the following: Botox for facial elastosis, check spot L lower eyelid, pt had something removed in past at [facility] and she thinks it is coming back  The following portions of the chart were reviewed this encounter and updated as appropriate: medications, allergies, medical history  Review of Systems:  No other skin or systemic complaints except as noted in HPI or Assessment and Plan.  Objective  Well appearing patient in no apparent distress; mood and affect are within normal limits.  A focused examination was performed of the face.  Relevant physical exam findings are noted in the Assessment and Plan.  Injection map photo      Assessment & Plan    Neoplasm of skin pt had something removed in past at [facility] and she thinks it is coming back Recurrence of what was removed in past vs other L lower lat eyelid margin articulating surface, above the lash line Exam: small pap of L lower lat eyellid margin of articulating surface above the lash line  Treatment Plan: Recommend referral to [facility]  Related Procedures Ambulatory referral to Ophthalmology   Facial Elastosis Botox 74 units injected today to: - Frown complex 20 units - Lat Brow lift 2.5 units x 2 (decreased from previous pattern of 4 units each side) - Forehead 6 units - Crows Feet 10 units x 2 - Bunny Lines 1 unit x 2 - Lateral Lip flip 4 units x 2 (4 units upper lip, 4 units lower lip) -DAO's 4 units x 2 - Chin 5 units Location: frown comples, lat brow lift bil, forehead, crow's feet bil, bunny lines, lat lip flip, DAO's, chin  Informed consent: Discussed risks (infection, pain, bleeding, bruising, swelling, allergic reaction, paralysis of nearby muscles, eyelid droop, double vision, neck weakness, difficulty breathing, headache, undesirable cosmetic result, and need for additional treatment)  and benefits of the procedure, as well as the alternatives.  Informed consent was obtained.  Preparation: The area was cleansed with alcohol.  Procedure Details:  Botox was injected into the dermis with a 30-gauge needle. Pressure applied to any bleeding. Ice packs offered for swelling.  Lot Number:  [id number] Expiration:  02/2025  Total Units Injected:  74  Plan: Tylenol may be used for headache.  Allow 2 weeks before returning to clinic for additional dosing as needed. Patient will call for any problems.   Return in about 1 month (around 08/23/2023) for Botox, lip flip.  I, Ardis Rowan, RMA, am acting as scribe for Armida Sans, MD .   Documentation: I have reviewed the above documentation for accuracy and completeness, and I agree with the above.  Armida Sans, MD

## 2023-07-24 ENCOUNTER — Ambulatory Visit: Payer: BC Managed Care – PPO | Admitting: Dermatology

## 2023-08-08 ENCOUNTER — Encounter: Payer: Self-pay | Admitting: Dermatology

## 2023-08-08 ENCOUNTER — Ambulatory Visit (INDEPENDENT_AMBULATORY_CARE_PROVIDER_SITE_OTHER): Payer: BC Managed Care – PPO | Admitting: Dermatology

## 2023-08-08 DIAGNOSIS — L738 Other specified follicular disorders: Secondary | ICD-10-CM | POA: Diagnosis not present

## 2023-08-08 DIAGNOSIS — L905 Scar conditions and fibrosis of skin: Secondary | ICD-10-CM

## 2023-08-08 DIAGNOSIS — Z1283 Encounter for screening for malignant neoplasm of skin: Secondary | ICD-10-CM

## 2023-08-08 DIAGNOSIS — L578 Other skin changes due to chronic exposure to nonionizing radiation: Secondary | ICD-10-CM

## 2023-08-08 DIAGNOSIS — D229 Melanocytic nevi, unspecified: Secondary | ICD-10-CM

## 2023-08-08 DIAGNOSIS — B351 Tinea unguium: Secondary | ICD-10-CM

## 2023-08-08 DIAGNOSIS — L814 Other melanin hyperpigmentation: Secondary | ICD-10-CM

## 2023-08-08 DIAGNOSIS — L821 Other seborrheic keratosis: Secondary | ICD-10-CM | POA: Diagnosis not present

## 2023-08-08 DIAGNOSIS — D492 Neoplasm of unspecified behavior of bone, soft tissue, and skin: Secondary | ICD-10-CM

## 2023-08-08 DIAGNOSIS — W908XXA Exposure to other nonionizing radiation, initial encounter: Secondary | ICD-10-CM

## 2023-08-08 DIAGNOSIS — D1801 Hemangioma of skin and subcutaneous tissue: Secondary | ICD-10-CM

## 2023-08-08 NOTE — Progress Notes (Signed)
Follow-Up Visit   Subjective  Abigail Gross is a 62 y.o. female who presents for the following: Skin Cancer Screening and Full Body Skin Exam. No personal Hx of skin cancer or dysplastic nevi.   The patient presents for Total-Body Skin Exam (TBSE) for skin cancer screening and mole check. The patient has spots, moles and lesions to be evaluated, some may be new or changing and the patient may have concern these could be cancer.    The following portions of the chart were reviewed this encounter and updated as appropriate: medications, allergies, medical history  Review of Systems:  No other skin or systemic complaints except as noted in HPI or Assessment and Plan.  Objective  Well appearing patient in no apparent distress; mood and affect are within normal limits.  A full examination was performed including scalp, head, eyes, ears, nose, lips, neck, chest, axillae, abdomen, back, buttocks, bilateral upper extremities, bilateral lower extremities, hands, feet, fingers, toes, fingernails, and toenails. All findings within normal limits unless otherwise noted below.   Relevant physical exam findings are noted in the Assessment and Plan.    Assessment & Plan   SCAR, lesion removed in [place].  Exam: Dyspigmented smooth macule or patch at left lateral thigh. Benign-appearing.  Observation.  Call clinic for new or changing lesions. Recommend daily broad spectrum sunscreen SPF 30+, reapply every 2 hours as needed. Treatment: Recommend Serica moisturizing scar formula cream every night or [employer] brand or Mederma silicone scar sheet every night for the first year after a scar appears to help with scar remodeling if desired. Scars remodel on their own for a full year and will gradually improve in appearance over time.    SKIN CANCER SCREENING PERFORMED TODAY.  ACTINIC DAMAGE - Chronic condition, secondary to cumulative UV/sun exposure - diffuse scaly erythematous macules with  underlying dyspigmentation - Recommend daily broad spectrum sunscreen SPF 30+ to sun-exposed areas, reapply every 2 hours as needed.  - Staying in the shade or wearing long sleeves, sun glasses (UVA+UVB protection) and wide brim hats (4-inch brim around the entire circumference of the hat) are also recommended for sun protection.  - Call for new or changing lesions.  LENTIGINES, SEBORRHEIC KERATOSES, HEMANGIOMAS - Benign normal skin lesions - Benign-appearing - Call for any changes  MELANOCYTIC NEVI - Tan-brown and/or pink-flesh-colored symmetric macules and papules - Benign appearing on exam today - Observation - Call clinic for new or changing moles - Recommend daily use of broad spectrum spf 30+ sunscreen to sun-exposed areas.   Sebaceous Hyperplasia - Small yellow papules with a central dell - Benign-appearing - Observe. Call for changes.   Neoplasm of skin pt had something removed in past at [facility] and she thinks it is coming back Recurrence of what was removed in past vs other L lower lat eyelid margin articulating surface, above the lash line Exam: small pigmented pap of L lower lat eyellid margin of articulating surface above the lash line  Referral was sent to [facility]. Patient states they have not called her. Advised we can resend that referral. Patient states she will monitor for changes. She will call to have referral sent again.   ONYCHOMYCOSIS Exam: Thickened toenails with subungal debris c/w onychomycosis  Chronic and persistent condition with duration or expected duration over one year. Condition is symptomatic/ bothersome to patient. Not currently at goal.  Reviewed labs from 06/17/2023. LFT WNL.   Treatment Plan:  Discussed oral treatment vs topical treatment.  Can start with OTC Terbinafine cream once daily if not wanting to take oral terbinafine. Can use Lamisil spray daily in shoes.  Call if would like to start oral Terbinafine.    Terbinafine Counseling  Terbinafine is an anti-fungal medicine that can be applied to the skin (over the counter) or taken by mouth (prescription) to treat fungal infections. The pill version is often used to treat fungal infections of the nails or scalp. While most people do not have any side effects from taking terbinafine pills, some possible side effects of the medicine can include taste changes, headache, loss of smell, vision changes, nausea, vomiting, or diarrhea.   Rare side effects can include irritation of the liver, allergic reaction, or decrease in blood counts (which may show up as not feeling well or developing an infection). If you are concerned about any of these side effects, please stop the medicine and call your doctor, or in the case of an emergency such as feeling very unwell, seek immediate medical care.   Sebaceous hyperplasia  Multiple benign nevi  Seborrheic keratoses  Lentigines  Actinic elastosis  Cherry angioma  Onychomycosis   Return in about 1 year (around 08/07/2024) for TBSE.  I, Lawson Radar, CMA, am acting as scribe for Elie Goody, MD.    Documentation: I have reviewed the above documentation for accuracy and completeness, and I agree with the above.  Elie Goody, MD

## 2023-08-08 NOTE — Patient Instructions (Addendum)
Can start with OTC Terbinafine cream once daily if not wanting to take oral terbinafine. Can use Lamisil/Terbinafine spray in shoes.  Call if would like to start oral Terbinafine.   Terbinafine Counseling  Terbinafine is an anti-fungal medicine that can be applied to the skin (over the counter) or taken by mouth (prescription) to treat fungal infections. The pill version is often used to treat fungal infections of the nails or scalp. While most people do not have any side effects from taking terbinafine pills, some possible side effects of the medicine can include taste changes, headache, loss of smell, vision changes, nausea, vomiting, or diarrhea.   Rare side effects can include irritation of the liver, allergic reaction, or decrease in blood counts (which may show up as not feeling well or developing an infection). If you are concerned about any of these side effects, please stop the medicine and call your doctor, or in the case of an emergency such as feeling very unwell, seek immediate medical care.    Recommend daily broad spectrum sunscreen SPF 30+ to sun-exposed areas, reapply every 2 hours as needed. Call for new or changing lesions.  Staying in the shade or wearing long sleeves, sun glasses (UVA+UVB protection) and wide brim hats (4-inch brim around the entire circumference of the hat) are also recommended for sun protection.    Melanoma ABCDEs  Melanoma is the most dangerous type of skin cancer, and is the leading cause of death from skin disease.  You are more likely to develop melanoma if you: Have light-colored skin, light-colored eyes, or red or blond hair Spend a lot of time in the sun Tan regularly, either outdoors or in a tanning bed Have had blistering sunburns, especially during childhood Have a close family member who has had a melanoma Have atypical moles or large birthmarks  Early detection of melanoma is key since treatment is typically straightforward and cure rates  are extremely high if we catch it early.   The first sign of melanoma is often a change in a mole or a new dark spot.  The ABCDE system is a way of remembering the signs of melanoma.  A for asymmetry:  The two halves do not match. B for border:  The edges of the growth are irregular. C for color:  A mixture of colors are present instead of an even brown color. D for diameter:  Melanomas are usually (but not always) greater than 6mm - the size of a pencil eraser. E for evolution:  The spot keeps changing in size, shape, and color.  Please check your skin once per month between visits. You can use a small mirror in front and a large mirror behind you to keep an eye on the back side or your body.   If you see any new or changing lesions before your next follow-up, please call to schedule a visit.  Please continue daily skin protection including broad spectrum sunscreen SPF 30+ to sun-exposed areas, reapplying every 2 hours as needed when you're outdoors.   Staying in the shade or wearing long sleeves, sun glasses (UVA+UVB protection) and wide brim hats (4-inch brim around the entire circumference of the hat) are also recommended for sun protection.      Due to recent changes in healthcare laws, you may see results of your pathology and/or laboratory studies on MyChart before the doctors have had a chance to review them. We understand that in some cases there may be results that are confusing  or concerning to you. Please understand that not all results are received at the same time and often the doctors may need to interpret multiple results in order to provide you with the best plan of care or course of treatment. Therefore, we ask that you please give [place] 2 business days to thoroughly review all your results before contacting the office for clarification. Should we see a critical lab result, you will be contacted sooner.   If You Need Anything After Your Visit  If you have any questions or  concerns for your doctor, please call our main line at [phone] and press option 4 to reach your doctor's medical assistant. If no one answers, please leave a voicemail as directed and we will return your call as soon as possible. Messages left after 4 pm will be answered the following business day.   You may also send [place] a message via MyChart. We typically respond to MyChart messages within 1-2 business days.  For prescription refills, please ask your pharmacy to contact our office. Our fax number is [phone].  If you have an urgent issue when the clinic is closed that cannot wait until the next business day, you can page your doctor at the number below.    Please note that while we do our best to be available for urgent issues outside of office hours, we are not available 24/7.   If you have an urgent issue and are unable to reach [place], you may choose to seek medical care at your doctor's office, retail clinic, urgent care center, or emergency room.  If you have a medical emergency, please immediately call 911 or go to the emergency department.  Pager Numbers  - Dr. Gwen Pounds: [phone]  - Dr. Roseanne Reno: [phone]  - Dr. Katrinka Blazing: [phone]   In the event of inclement weather, please call our main line at [phone] for an update on the status of any delays or closures.  Dermatology Medication Tips: Please keep the boxes that topical medications come in in order to help keep track of the instructions about where and how to use these. Pharmacies typically print the medication instructions only on the boxes and not directly on the medication tubes.   If your medication is too expensive, please contact our office at [phone] option 4 or send [place] a message through MyChart.   We are unable to tell what your co-pay for medications will be in advance as this is different depending on your insurance coverage. However, we may be able to find a substitute medication at lower cost  or fill out paperwork to get insurance to cover a needed medication.   If a prior authorization is required to get your medication covered by your insurance company, please allow [place] 1-2 business days to complete this process.  Drug prices often vary depending on where the prescription is filled and some pharmacies may offer cheaper prices.  The website www.goodrx.com contains coupons for medications through different pharmacies. The prices here do not account for what the cost may be with help from insurance (it may be cheaper with your insurance), but the website can give you the price if you did not use any insurance.  - You can print the associated coupon and take it with your prescription to the pharmacy.  - You may also stop by our office during regular business hours and pick up a GoodRx coupon card.  - If you need your prescription sent electronically to a different pharmacy, notify our  office through [facility] or by phone at [phone] option 4.     Si Usted Necesita Algo Despus de Su Visita  Tambin puede enviarnos un mensaje a travs de Clinical cytogeneticist. Por lo general respondemos a los mensajes de MyChart en el transcurso de 1 a 2 das hbiles.  Para renovar recetas, por favor pida a su farmacia que se ponga en contacto con nuestra oficina. Annie Sable de fax es [place] [phone].  Si tiene un asunto urgente cuando la clnica est cerrada y que no puede esperar hasta el siguiente da hbil, puede llamar/localizar a su doctor(a) al nmero que aparece a continuacin.   Por favor, tenga en cuenta que aunque hacemos todo lo posible para estar disponibles para asuntos urgentes fuera del horario de [place], no estamos disponibles las 24 horas del da, los 7 [address]  [address] de la [place].   Si tiene un problema urgente y no puede comunicarse con nosotros, puede optar por buscar atencin mdica  en el consultorio de su doctor(a), en una clnica privada, en un centro de atencin urgente o en una  sala de emergencias.  Si tiene Engineer, drilling, por favor llame inmediatamente al 911 o vaya a la sala de emergencias.  Nmeros de bper  - Dr. Gwen Pounds: [phone]  - Dra. Roseanne Reno: [phone]  - Dr. Katrinka Blazing: [phone]   En caso de inclemencias del tiempo, por favor llame a Lacy Duverney principal al [phone] para una actualizacin sobre el [place] de cualquier retraso o cierre.  Consejos para la medicacin en dermatologa: Por favor, guarde las cajas en las que vienen los medicamentos de uso tpico para ayudarle a seguir las instrucciones sobre dnde y cmo usarlos. Las farmacias generalmente imprimen las instrucciones del medicamento slo en las cajas y no directamente en los tubos del [place].   Si su medicamento es muy caro, por favor, pngase en contacto con Rolm Gala llamando al [phone] y presione la opcin 4 o envenos un mensaje a travs de Clinical cytogeneticist.   No podemos decirle cul ser su copago por los medicamentos por adelantado ya que esto es diferente dependiendo de la cobertura de su seguro. Sin embargo, es posible que podamos encontrar un medicamento sustituto a Audiological scientist un formulario para que el seguro cubra el medicamento que se considera necesario.   Si se requiere una autorizacin previa para que su compaa de seguros [place] su medicamento, por favor permtanos de 1 a 2 das hbiles para completar [address]  Los precios de los medicamentos varan con frecuencia dependiendo del Environmental consultant de dnde se surte la receta y alguna farmacias pueden ofrecer precios ms baratos.  El sitio web www.goodrx.com tiene cupones para medicamentos de Health and safety inspector. Los precios aqu no tienen en cuenta lo que podra costar con la ayuda del seguro (puede ser ms barato con su seguro), pero el sitio web puede darle el precio si no utiliz Tourist information centre manager.  - Puede imprimir el cupn correspondiente y llevarlo con su receta a la farmacia.  - Tambin puede  pasar por nuestra oficina durante el horario de atencin regular y Education officer, museum una tarjeta de cupones de GoodRx.  - Si necesita que su receta se enve electrnicamente a una farmacia diferente, informe a nuestra oficina a travs de MyChart de [place] o por telfono llamando al [phone] y presione la opcin 4.

## 2023-08-27 ENCOUNTER — Encounter: Payer: Self-pay | Admitting: Dermatology

## 2023-08-27 ENCOUNTER — Ambulatory Visit (INDEPENDENT_AMBULATORY_CARE_PROVIDER_SITE_OTHER): Payer: Self-pay | Admitting: Dermatology

## 2023-08-27 DIAGNOSIS — L988 Other specified disorders of the skin and subcutaneous tissue: Secondary | ICD-10-CM

## 2023-08-27 NOTE — Progress Notes (Signed)
   Follow-Up Visit   Subjective  Abigail Gross is a 62 y.o. female who presents for the following: Botox for facial elastosis  The following portions of the chart were reviewed this encounter and updated as appropriate: medications, allergies, medical history  Review of Systems:  No other skin or systemic complaints except as noted in HPI or Assessment and Plan.  Objective  Well appearing patient in no apparent distress; mood and affect are within normal limits.  A focused examination was performed of the face.  Relevant physical exam findings are noted in the Assessment and Plan.  Injection map photo     Assessment & Plan   Elastosis of skin   Facial Elastosis  Location: See attached image Botox 8 units injected today to: - Lateral lip flip upper and lower 8 units  Informed consent: Discussed risks (infection, pain, bleeding, bruising, swelling, allergic reaction, paralysis of nearby muscles, eyelid droop, double vision, neck weakness, difficulty breathing, headache, undesirable cosmetic result, and need for additional treatment) and benefits of the procedure, as well as the alternatives.  Informed consent was obtained.  Preparation: The area was cleansed with alcohol.  Procedure Details:  Botox was injected into the dermis with a 30-gauge needle. Pressure applied to any bleeding. Ice packs offered for swelling.  Lot Number:  [id number]  Expiration:  05/2025  Total Units Injected:  8  Plan: Tylenol may be used for headache.  Allow 2 weeks before returning to clinic for additional dosing as needed. Patient will call for any problems.  Return in about 1 month (around 09/27/2023) for botox lip flip.  I, Ardis Rowan, RMA, am acting as scribe for Armida Sans, MD .   Documentation: I have reviewed the above documentation for accuracy and completeness, and I agree with the above.  Armida Sans, MD

## 2023-08-27 NOTE — Patient Instructions (Signed)

## 2023-09-30 ENCOUNTER — Ambulatory Visit (INDEPENDENT_AMBULATORY_CARE_PROVIDER_SITE_OTHER): Payer: BC Managed Care – PPO | Admitting: Dermatology

## 2023-09-30 DIAGNOSIS — L988 Other specified disorders of the skin and subcutaneous tissue: Secondary | ICD-10-CM

## 2023-09-30 NOTE — Progress Notes (Signed)
   Follow-Up Visit   Subjective  Abigail Gross is a 63 y.o. female who presents for the following: Botox for facial elastosis  The following portions of the chart were reviewed this encounter and updated as appropriate: medications, allergies, medical history  Review of Systems:  No other skin or systemic complaints except as noted in HPI or Assessment and Plan.  Objective  Well appearing patient in no apparent distress; mood and affect are within normal limits.  A focused examination was performed of the face.  Relevant physical exam findings are noted in the Assessment and Plan.   Injection map photo     Assessment & Plan    Facial Elastosis  Location: See attached image  Informed consent: Discussed risks (infection, pain, bleeding, bruising, swelling, allergic reaction, paralysis of nearby muscles, eyelid droop, double vision, neck weakness, difficulty breathing, headache, undesirable cosmetic result, and need for additional treatment) and benefits of the procedure, as well as the alternatives.  Informed consent was obtained.  Preparation: The area was cleansed with alcohol.  Procedure Details:  Botox was injected into the dermis with a 30-gauge needle. Pressure applied to any bleeding. Ice packs offered for swelling.  Lot Number:  [id number] Expiration:  07/2025  Total Units Injected:  8  Plan: Tylenol may be used for headache.  Allow 2 weeks before returning to clinic for additional dosing as needed. Patient will call for any problems.  Return for end of jan botox with dr. jackquline .  IEleanor Blush, CMA, am acting as scribe for Alm Rhyme, MD.   Documentation: I have reviewed the above documentation for accuracy and completeness, and I agree with the above.  Alm Rhyme, MD

## 2023-09-30 NOTE — Patient Instructions (Signed)

## 2023-10-14 ENCOUNTER — Encounter: Payer: Self-pay | Admitting: Dermatology

## 2023-10-30 ENCOUNTER — Ambulatory Visit (INDEPENDENT_AMBULATORY_CARE_PROVIDER_SITE_OTHER): Payer: Self-pay | Admitting: Dermatology

## 2023-10-30 DIAGNOSIS — L988 Other specified disorders of the skin and subcutaneous tissue: Secondary | ICD-10-CM

## 2023-10-30 NOTE — Progress Notes (Signed)
   Follow-Up Visit   Subjective  Abigail Gross is a 63 y.o. female who presents for the following: Botox for facial elastosis  The following portions of the chart were reviewed this encounter and updated as appropriate: medications, allergies, medical history  Review of Systems:  No other skin or systemic complaints except as noted in HPI or Assessment and Plan.  Objective  Well appearing patient in no apparent distress; mood and affect are within normal limits.  A focused examination was performed of the face.  Relevant physical exam findings are noted in the Assessment and Plan.      Assessment & Plan    Facial Elastosis  Botox 74 units injected today to: - Frown complex 20 units - Lat Brow lift 2.5 units x 2 (decreased from previous pattern of 4 units each side) - Forehead 6 units - Crows Feet 10 units x 2 - Bunny Lines 1 unit x 2 - Lateral Lip flip 4 units x 2 (4 units upper lip, 4 units lower lip) -DAO's 4 units x 2 - Chin 5 units  Location: See attached image  Informed consent: Discussed risks (infection, pain, bleeding, bruising, swelling, allergic reaction, paralysis of nearby muscles, eyelid droop, double vision, neck weakness, difficulty breathing, headache, undesirable cosmetic result, and need for additional treatment) and benefits of the procedure, as well as the alternatives.  Informed consent was obtained.  Preparation: The area was cleansed with alcohol.  Procedure Details:  Botox was injected into the dermis with a 30-gauge needle. Pressure applied to any bleeding. Ice packs offered for swelling.  Lot Number:  [id number] Expiration:  07/2025  Total Units Injected:  74  Plan: Tylenol may be used for headache.  Allow 2 weeks before returning to clinic for additional dosing as needed. Patient will call for any problems.  Return in about 3 months (around 01/27/2024) for Botox injections.  LILLETTE Rosina Mayans, CMA, am acting as scribe for Rexene Rattler, MD  .   Documentation: I have reviewed the above documentation for accuracy and completeness, and I agree with the above.  Rexene Rattler, MD

## 2023-12-02 ENCOUNTER — Ambulatory Visit (INDEPENDENT_AMBULATORY_CARE_PROVIDER_SITE_OTHER): Admitting: Dermatology

## 2023-12-02 ENCOUNTER — Ambulatory Visit: Payer: Self-pay | Admitting: Dermatology

## 2023-12-02 DIAGNOSIS — Z7189 Other specified counseling: Secondary | ICD-10-CM

## 2023-12-02 DIAGNOSIS — L821 Other seborrheic keratosis: Secondary | ICD-10-CM

## 2023-12-02 DIAGNOSIS — L578 Other skin changes due to chronic exposure to nonionizing radiation: Secondary | ICD-10-CM

## 2023-12-02 DIAGNOSIS — L988 Other specified disorders of the skin and subcutaneous tissue: Secondary | ICD-10-CM

## 2023-12-02 DIAGNOSIS — L820 Inflamed seborrheic keratosis: Secondary | ICD-10-CM | POA: Diagnosis not present

## 2023-12-02 DIAGNOSIS — W908XXA Exposure to other nonionizing radiation, initial encounter: Secondary | ICD-10-CM | POA: Diagnosis not present

## 2023-12-02 NOTE — Progress Notes (Unsigned)
 Follow-Up Visit   Subjective  Abigail Gross is a 63 y.o. female who presents for the following: Patient here today for spot at right clavicle area and botox for lip flip  The patient has spots, moles and lesions to be evaluated, some may be new or changing and the patient may have concern these could be cancer.  The following portions of the chart were reviewed this encounter and updated as appropriate: medications, allergies, medical history  Review of Systems:  No other skin or systemic complaints except as noted in HPI or Assessment and Plan.  Objective  Well appearing patient in no apparent distress; mood and affect are within normal limits.   A focused examination was performed of the following areas: Face, right clavicle area  Relevant exam findings are noted in the Assessment and Plan.   right clavicle x 1 Erythematous stuck-on, waxy papule or plaque  Assessment & Plan   FACIAL ELASTOSIS Exam: Rhytides and volume loss.  Treatment Plan: Facial Elastosis Location: lateral lip flip area   Informed consent: Discussed risks (infection, pain, bleeding, bruising, swelling, allergic reaction, paralysis of nearby muscles, eyelid droop, double vision, neck weakness, difficulty breathing, headache, undesirable cosmetic result, and need for additional treatment) and benefits of the procedure, as well as the alternatives.  Informed consent was obtained.  Preparation: The area was cleansed with alcohol.  Procedure Details:  Botox was injected into the dermis with a 30-gauge needle. Pressure applied to any bleeding. Ice packs offered for swelling.  Lot Number:  [id number] Expiration:  11/2025  Total Units Injected:  8 units   Plan: Tylenol may be used for headache.  Allow 2 weeks before returning to clinic for additional dosing as needed. Patient will call for any problems.  Recommend daily broad spectrum sunscreen SPF 30+ to sun-exposed areas, reapply every 2 hours as needed.  Call for new or changing lesions.  Staying in the shade or wearing long sleeves, sun glasses (UVA+UVB protection) and wide brim hats (4-inch brim around the entire circumference of the hat) are also recommended for sun protection.   Treatment Plan: Discussed recommendations plastic surgeons that patient could contact.  FACIAL PLASTIC SURGEONS:  [facility] in [place] phone number [phone] Dr. Conni Elliot in [facility] [place] phone #[phone] Dr. Lemmie Evens in [facility] phone number [phone] Dr. Miguel Dibble in [place] phone #[phone]  OCULOPLASTIC SURGEONS (UPPER/LOWER BLEPHAROPLASTY):  Dr. Nira Retort in [place] phone #[phone] Dr. Myrna Blazer in [facility] at [facility] phone number [phone]  Recommend daily broad spectrum sunscreen SPF 30+ to sun-exposed areas, reapply every 2 hours as needed. Call for new or changing lesions.  Staying in the shade or wearing long sleeves, sun glasses (UVA+UVB protection) and wide brim hats (4-inch brim around the entire circumference of the hat) are also recommended for sun protection.   INFLAMED SEBORRHEIC KERATOSIS right clavicle x 1 Symptomatic, irritating, patient would like treated. Destruction of lesion - right clavicle x 1 Complexity: simple   Destruction method: cryotherapy   Informed consent: discussed and consent obtained   Timeout:  patient name, date of birth, surgical site, and procedure verified Lesion destroyed using liquid nitrogen: Yes   Region frozen until ice ball extended beyond lesion: Yes   Outcome: patient tolerated procedure well with no complications   Post-procedure details: wound care instructions given   ELASTOSIS OF SKIN     SEBORRHEIC KERATOSIS - Stuck-on, waxy, tan-brown papules and/or plaques  -  Benign-appearing - Discussed benign etiology and prognosis. - Observe - Call for any changes  ACTINIC DAMAGE -  chronic, secondary to cumulative UV radiation exposure/sun exposure over time - diffuse scaly erythematous macules with underlying dyspigmentation - Recommend daily broad spectrum sunscreen SPF 30+ to sun-exposed areas, reapply every 2 hours as needed.  - Recommend staying in the shade or wearing long sleeves, sun glasses (UVA+UVB protection) and wide brim hats (4-inch brim around the entire circumference of the hat). - Call for new or changing lesions.  Return for 3 - 4 month botox followup .  IAsher Muir, CMA, am acting as scribe for Armida Sans, MD.   Documentation: I have reviewed the above documentation for accuracy and completeness, and I agree with the above.  Armida Sans, MD

## 2023-12-02 NOTE — Patient Instructions (Addendum)
 Seborrheic Keratosis  What causes seborrheic keratoses? Seborrheic keratoses are harmless, common skin growths that first appear during adult life.  As time goes by, more growths appear.  Some people may develop a large number of them.  Seborrheic keratoses appear on both covered and uncovered body parts.  They are not caused by sunlight.  The tendency to develop seborrheic keratoses can be inherited.  They vary in color from skin-colored to gray, brown, or even black.  They can be either smooth or have a rough, warty surface.   Seborrheic keratoses are superficial and look as if they were stuck on the skin.  Under the microscope this type of keratosis looks like layers upon layers of skin.  That is why at times the top layer may seem to fall off, but the rest of the growth remains and re-grows.    Treatment Seborrheic keratoses do not need to be treated, but can easily be removed in the office.  Seborrheic keratoses often cause symptoms when they rub on clothing or jewelry.  Lesions can be in the way of shaving.  If they become inflamed, they can cause itching, soreness, or burning.  Removal of a seborrheic keratosis can be accomplished by freezing, burning, or surgery. If any spot bleeds, scabs, or grows rapidly, please return to have it checked, as these can be an indication of a skin cancer.   Cryotherapy Aftercare  Wash gently with soap and water everyday.   Apply Vaseline and Band-Aid daily until healed.     Due to recent changes in healthcare laws, you may see results of your pathology and/or laboratory studies on MyChart before the doctors have had a chance to review them. We understand that in some cases there may be results that are confusing or concerning to you. Please understand that not all results are received at the same time and often the doctors may need to interpret multiple results in order to provide you with the best plan of care or course of treatment. Therefore, we  ask that you please give [place] 2 business days to thoroughly review all your results before contacting the office for clarification. Should we see a critical lab result, you will be contacted sooner.   If You Need Anything After Your Visit  If you have any questions or concerns for your doctor, please call our main line at [phone] and press option 4 to reach your doctor's medical assistant. If no one answers, please leave a voicemail as directed and we will return your call as soon as possible. Messages left after 4 pm will be answered the following business day.   You may also send [place] a message via MyChart. We typically respond to MyChart messages within 1-2 business days.  For prescription refills, please ask your pharmacy to contact our office. Our fax number is [phone].  If you have an urgent issue when the clinic is closed that cannot wait until the next business day, you can page your doctor at the number below.    Please note that while we do our best to be available for urgent issues outside of office hours, we are not available 24/7.   If you have an urgent issue and are unable to reach [place], you may choose to seek medical care at your doctor's office, retail clinic, urgent care center, or emergency room.  If you have a medical emergency, please immediately call 911 or go to the emergency department.  Pager Numbers  -  Dr. Gwen Pounds: [phone]  - Dr. Roseanne Reno: [phone]  - Dr. Katrinka Blazing: [phone]   In the event of inclement weather, please call our main line at [phone] for an update on the status of any delays or closures.  Dermatology Medication Tips: Please keep the boxes that topical medications come in in order to help keep track of the instructions about where and how to use these. Pharmacies typically print the medication instructions only on the boxes and not directly on the medication tubes.   If your medication is too expensive, please contact our office  at [phone] option 4 or send [place] a message through MyChart.   We are unable to tell what your co-pay for medications will be in advance as this is different depending on your insurance coverage. However, we may be able to find a substitute medication at lower cost or fill out paperwork to get insurance to cover a needed medication.   If a prior authorization is required to get your medication covered by your insurance company, please allow [place] 1-2 business days to complete this process.  Drug prices often vary depending on where the prescription is filled and some pharmacies may offer cheaper prices.  The website www.goodrx.com contains coupons for medications through different pharmacies. The prices here do not account for what the cost may be with help from insurance (it may be cheaper with your insurance), but the website can give you the price if you did not use any insurance.  - You can print the associated coupon and take it with your prescription to the pharmacy.  - You may also stop by our office during regular business hours and pick up a GoodRx coupon card.  - If you need your prescription sent electronically to a different pharmacy, notify our office through [facility] or by phone at [phone] option 4.     Si Usted Necesita Algo Despus de Su Visita  Tambin puede enviarnos un mensaje a travs de Clinical cytogeneticist. Por lo general respondemos a los mensajes de MyChart en el transcurso de 1 a 2 das hbiles.  Para renovar recetas, por favor pida a su farmacia que se ponga en contacto con nuestra oficina. Annie Sable de fax es [place] [phone].  Si tiene un asunto urgente cuando la clnica est cerrada y que no puede esperar hasta el siguiente da hbil, puede llamar/localizar a su doctor(a) al nmero que aparece a continuacin.   Por favor, tenga en cuenta que aunque hacemos todo lo posible para estar disponibles para asuntos urgentes fuera del horario de [place], no estamos  disponibles las 24 horas del da, los 7 [address]  [address] de la [place].   Si tiene un problema urgente y no puede comunicarse con nosotros, puede optar por buscar atencin mdica  en el consultorio de su doctor(a), en una clnica privada, en un centro de atencin urgente o en una sala de emergencias.  Si tiene Engineer, drilling, por favor llame inmediatamente al 911 o vaya a la sala de emergencias.  Nmeros de bper  - Dr. Gwen Pounds: [phone]  - Dra. Roseanne Reno: [phone]  - Dr. Katrinka Blazing: [phone]   En caso de inclemencias del tiempo, por favor llame a Lacy Duverney principal al [phone] para una actualizacin sobre el [place] de cualquier retraso o cierre.  Consejos para la medicacin en dermatologa: Por favor, guarde las cajas en las que vienen los medicamentos de uso tpico para ayudarle a seguir las instrucciones sobre dnde y cmo usarlos. Las farmacias generalmente imprimen las instrucciones  del medicamento slo en las cajas y no directamente en los tubos del medicamento.   Si su medicamento es muy caro, por favor, pngase en contacto con Rolm Gala llamando al [phone] y presione la opcin 4 o envenos un mensaje a travs de Clinical cytogeneticist.   No podemos decirle cul ser su copago por los medicamentos por adelantado ya que esto es diferente dependiendo de la cobertura de su seguro. Sin embargo, es posible que podamos encontrar un medicamento sustituto a Audiological scientist un formulario para que el seguro cubra el medicamento que se considera necesario.   Si se requiere una autorizacin previa para que su compaa de seguros [place] su medicamento, por favor permtanos de 1 a 2 das hbiles para completar [address]  Los precios de los medicamentos varan con frecuencia dependiendo del Environmental consultant de dnde se surte la receta y alguna farmacias pueden ofrecer precios ms baratos.  El sitio web www.goodrx.com tiene cupones para medicamentos de Health and safety inspector. Los precios aqu no  tienen en cuenta lo que podra costar con la ayuda del seguro (puede ser ms barato con su seguro), pero el sitio web puede darle el precio si no utiliz Tourist information centre manager.  - Puede imprimir el cupn correspondiente y llevarlo con su receta a la farmacia.  - Tambin puede pasar por nuestra oficina durante el horario de atencin regular y Education officer, museum una tarjeta de cupones de GoodRx.  - Si necesita que su receta se enve electrnicamente a una farmacia diferente, informe a nuestra oficina a travs de MyChart de [place] o por telfono llamando al [phone] y presione la opcin 4.

## 2023-12-03 ENCOUNTER — Encounter: Payer: Self-pay | Admitting: Dermatology

## 2023-12-04 ENCOUNTER — Telehealth: Payer: Self-pay

## 2023-12-04 NOTE — Telephone Encounter (Signed)
 Called patient regarding her previous appointment on Monday in regards to plastic surgeon recommendations. Asked patient to return call. Have sent a list of recommended plastic surgeons she can view through her mychart.

## 2023-12-19 ENCOUNTER — Ambulatory Visit: Payer: BC Managed Care – PPO | Admitting: Dermatology

## 2023-12-24 ENCOUNTER — Ambulatory Visit (INDEPENDENT_AMBULATORY_CARE_PROVIDER_SITE_OTHER): Payer: Self-pay | Admitting: Dermatology

## 2023-12-24 ENCOUNTER — Encounter: Payer: Self-pay | Admitting: Dermatology

## 2023-12-24 DIAGNOSIS — M67472 Ganglion, left ankle and foot: Secondary | ICD-10-CM

## 2023-12-24 DIAGNOSIS — L988 Other specified disorders of the skin and subcutaneous tissue: Secondary | ICD-10-CM

## 2023-12-24 NOTE — Progress Notes (Signed)
   Follow-Up Visit   Subjective  Abigail Gross is a 63 y.o. female who presents for the following: Botox for facial elastosis, check blister L middle toe  The following portions of the chart were reviewed this encounter and updated as appropriate: medications, allergies, medical history  Review of Systems:  No other skin or systemic complaints except as noted in HPI or Assessment and Plan.  Objective  Well appearing patient in no apparent distress; mood and affect are within normal limits.  A focused examination was performed of the face, L foot  Relevant physical exam findings are noted in the Assessment and Plan.  Injection map photo     Assessment & Plan    Facial Elastosis Botox 8 units injected today to: - Lateral lip flip upper and lower 8 units Location: See attached image  Informed consent: Discussed risks (infection, pain, bleeding, bruising, swelling, allergic reaction, paralysis of nearby muscles, eyelid droop, double vision, neck weakness, difficulty breathing, headache, undesirable cosmetic result, and need for additional treatment) and benefits of the procedure, as well as the alternatives.  Informed consent was obtained.  Preparation: The area was cleansed with alcohol.  Procedure Details:  Botox was injected into the dermis with a 30-gauge needle. Pressure applied to any bleeding. Ice packs offered for swelling.  Lot Number:  [id number] Expiration:  10/2025  Total Units Injected:  8  Plan: Tylenol may be used for headache.  Allow 2 weeks before returning to clinic for additional dosing as needed. Patient will call for any problems.  DIGITAL MUCOUS CYST L middle toe Exam: Solitary, smooth skin colored to translucent papule L middle toe.  A digital mucous cyst also known as a myxoid cyst or pseudocyst is a ganglion cyst arising from the distal interphalangeal (DIP) joint of the finger or thumb (or, less commonly, toe). The cysts are believed to form from  degeneration of connective tissue and are associated with osteoarthritic joints or injury. Although the exact etiology is unknown, it is likely that a small tear forms in a joint capsule or tendon sheath, allowing extravasation of synovial fluid into the adjacent tissue. When the fluid reacts with local tissue, it becomes more gelatinous and a cyst wall forms. With any treatment, there is a high rate of recurrence.   Treatment options include: - Puncture / Incision & Drainage (I&D) - Intralesional steroid injection - Intralesional Sclerosant injection (Asclera/ Polidocanol) - Intralesional steroid + sclerosant - Corticosteroid tape - Cryosurgery - Laser (CO2) - Infrared photocoagulation - Excision / Surgery  Treatment Plan: Discussed treatment options and risks of recurrence   Return for as scheduled.  I, Ardis Rowan, RMA, am acting as scribe for Armida Sans, MD .   Documentation: I have reviewed the above documentation for accuracy and completeness, and I agree with the above.  Armida Sans, MD

## 2023-12-24 NOTE — Patient Instructions (Signed)

## 2023-12-30 ENCOUNTER — Ambulatory Visit (INDEPENDENT_AMBULATORY_CARE_PROVIDER_SITE_OTHER): Admitting: Dermatology

## 2023-12-30 DIAGNOSIS — L988 Other specified disorders of the skin and subcutaneous tissue: Secondary | ICD-10-CM

## 2023-12-30 DIAGNOSIS — L821 Other seborrheic keratosis: Secondary | ICD-10-CM

## 2023-12-30 NOTE — Progress Notes (Signed)
   Follow-Up Visit   Subjective  Abigail Gross is a 63 y.o. female who presents for the following: filler for facial elastosis and to discuss more botox at left forehead above brow and bumps under arms  The following portions of the chart were reviewed this encounter and updated as appropriate: medications, allergies, medical history  Review of Systems:  No other skin or systemic complaints except as noted in HPI or Assessment and Plan.  Objective  Well appearing patient in no apparent distress; mood and affect are within normal limits.  A focused examination was performed of the face. Relevant physical exam findings are noted in the Assessment and Plan or shown in photos.  Before photos               After photos                Injection map photo     Assessment & Plan    Facial Elastosis  Prior to the procedure, the patient's past medical history, allergies and the rare but potential risks and complications were reviewed with the patient and a signed consent was obtained. Pre and post-treatment care was discussed and instructions provided.   Location: perioral, nasolabial folds, and marionette lines  Filler Type: Restylane defyne Lot # [id number] Exp 06/23/2024   Procedure: The area was prepped thoroughly with Puracyn. After introducing the needle into the desired treatment area, the syringe plunger was drawn back to ensure there was no flash of blood prior to injecting the filler in order to minimize risk of intravascular injection and vascular occlusion. After injection of the filler, the treated areas were cleansed and iced to reduce swelling. Post-treatment instructions were reviewed with the patient.       Patient tolerated the procedure well. The patient will call with any problems, questions or concerns prior to their next appointment.  FACIAL ELASTOSIS Exam: Rhytides and volume loss.  Treatment Plan: Patient feels may need botox at left  botox comma since has wrinkle on that side  Will consider adding botox to botox commas at next botox follow up  Recommend daily broad spectrum sunscreen SPF 30+ to sun-exposed areas, reapply every 2 hours as needed. Call for new or changing lesions.  Staying in the shade or wearing long sleeves, sun glasses (UVA+UVB protection) and wide brim hats (4-inch brim around the entire circumference of the hat) are also recommended for sun protection.   SEBORRHEIC KERATOSIS Under b/l arms  - Stuck-on, waxy, tan-brown papules and/or plaques  - Benign-appearing - Discussed benign etiology and prognosis. - Observe - Call for any changes  Recommend starting moisturizer with exfoliant (Urea, Salicylic acid, or Lactic acid) one to two times daily to help smooth rough and bumpy skin.  OTC options include Cetaphil Rough and Bumpy lotion (Urea), Eucerin Roughness Relief lotion or spot treatment cream (Urea), CeraVe SA lotion/cream for Rough and Bumpy skin (Sal Acid), Gold Bond Rough and Bumpy cream (Sal Acid), and AmLactin 12% lotion/cream (Lactic Acid).  If applying in morning, also apply sunscreen to sun-exposed areas, since these exfoliating moisturizers can increase sensitivity to sun.   Return for keep follow up as scheduled .  I, Asher Muir, CMA, am acting as scribe for Willeen Niece, MD.   Documentation: I have reviewed the above documentation for accuracy and completeness, and I agree with the above.  Willeen Niece, MD

## 2023-12-30 NOTE — Patient Instructions (Addendum)
 For bumpy areas under arms    Recommend starting moisturizer with exfoliant (Urea, Salicylic acid, or Lactic acid) one to two times daily to help smooth rough and bumpy skin.  OTC options include Cetaphil Rough and Bumpy lotion (Urea), Eucerin Roughness Relief lotion or spot treatment cream (Urea), CeraVe SA lotion/cream for Rough and Bumpy skin (Sal Acid), Gold Bond Rough and Bumpy cream (Sal Acid), and AmLactin 12% lotion/cream (Lactic Acid).  If applying in morning, also apply sunscreen to sun-exposed areas, since these exfoliating moisturizers can increase sensitivity to sun.  Seborrheic Keratosis  What causes seborrheic keratoses? Seborrheic keratoses are harmless, common skin growths that first appear during adult life.  As time goes by, more growths appear.  Some people may develop a large number of them.  Seborrheic keratoses appear on both covered and uncovered body parts.  They are not caused by sunlight.  The tendency to develop seborrheic keratoses can be inherited.  They vary in color from skin-colored to gray, brown, or even black.  They can be either smooth or have a rough, warty surface.   Seborrheic keratoses are superficial and look as if they were stuck on the skin.  Under the microscope this type of keratosis looks like layers upon layers of skin.  That is why at times the top layer may seem to fall off, but the rest of the growth remains and re-grows.    Treatment Seborrheic keratoses do not need to be treated, but can easily be removed in the office.  Seborrheic keratoses often cause symptoms when they rub on clothing or jewelry.  Lesions can be in the way of shaving.  If they become inflamed, they can cause itching, soreness, or burning.  Removal of a seborrheic keratosis can be accomplished by freezing, burning, or surgery. If any spot bleeds, scabs, or grows rapidly, please return to have it checked, as these can be an indication of a skin cancer.     Recommended for Filler  with cannula  Abigail Gross, M.D. [address], [address] [place], [place] [address] [phone]  [email or web address]  Or  Gelene Mink. Adigum MD  [address] [place] [address] Renee Rival  [place], [place] [address] Phone: [phone] Fax: [phone] Office Hours: Monday - Friday 8:30am to 4:30pm   Due to recent changes in healthcare laws, you may see results of your pathology and/or laboratory studies on MyChart before the doctors have had a chance to review them. We understand that in some cases there may be results that are confusing or concerning to you. Please understand that not all results are received at the same time and often the doctors may need to interpret multiple results in order to provide you with the best plan of care or course of treatment. Therefore, we ask that you please give [place] 2 business days to thoroughly review all your results before contacting the office for clarification. Should we see a critical lab result, you will be contacted sooner.   If You Need Anything After Your Visit  If you have any questions or concerns for your doctor, please call our main line at [phone] and press option 4 to reach your doctor's medical assistant. If no one answers, please leave a voicemail as directed and we will return your call as soon as possible. Messages left after 4 pm will be answered the following business day.   You may also send [place] a message via MyChart. We typically respond to MyChart messages within 1-2  business days.  For prescription refills, please ask your pharmacy to contact our office. Our fax number is [phone].  If you have an urgent issue when the clinic is closed that cannot wait until the next business day, you can page your doctor at the number below.    Please note that while we do our best to be available for urgent issues outside of office hours, we are not available 24/7.   If you have an urgent issue and are unable to reach [place], you may  choose to seek medical care at your doctor's office, retail clinic, urgent care center, or emergency room.  If you have a medical emergency, please immediately call 911 or go to the emergency department.  Pager Numbers  - Dr. Gwen Pounds: [phone]  - Dr. Roseanne Reno: [phone]  - Dr. Katrinka Blazing: [phone]   In the event of inclement weather, please call our main line at [phone] for an update on the status of any delays or closures.  Dermatology Medication Tips: Please keep the boxes that topical medications come in in order to help keep track of the instructions about where and how to use these. Pharmacies typically print the medication instructions only on the boxes and not directly on the medication tubes.   If your medication is too expensive, please contact our office at [phone] option 4 or send [place] a message through MyChart.   We are unable to tell what your co-pay for medications will be in advance as this is different depending on your insurance coverage. However, we may be able to find a substitute medication at lower cost or fill out paperwork to get insurance to cover a needed medication.   If a prior authorization is required to get your medication covered by your insurance company, please allow [place] 1-2 business days to complete this process.  Drug prices often vary depending on where the prescription is filled and some pharmacies may offer cheaper prices.  The website www.goodrx.com contains coupons for medications through different pharmacies. The prices here do not account for what the cost may be with help from insurance (it may be cheaper with your insurance), but the website can give you the price if you did not use any insurance.  - You can print the associated coupon and take it with your prescription to the pharmacy.  - You may also stop by our office during regular business hours and pick up a GoodRx coupon card.  - If you need your prescription sent  electronically to a different pharmacy, notify our office through [facility] or by phone at [phone] option 4.     Si Usted Necesita Algo Despus de Su Visita  Tambin puede enviarnos un mensaje a travs de Clinical cytogeneticist. Por lo general respondemos a los mensajes de MyChart en el transcurso de 1 a 2 das hbiles.  Para renovar recetas, por favor pida a su farmacia que se ponga en contacto con nuestra oficina. Annie Sable de fax es [place] [phone].  Si tiene un asunto urgente cuando la clnica est cerrada y que no puede esperar hasta el siguiente da hbil, puede llamar/localizar a su doctor(a) al nmero que aparece a continuacin.   Por favor, tenga en cuenta que aunque hacemos todo lo posible para estar disponibles para asuntos urgentes fuera del horario de [place], no estamos disponibles las 24 horas del da, los 7 [address]  [address] de la [place].   Si tiene un problema urgente y no puede comunicarse con nosotros, puede optar por buscar  atencin mdica  en el consultorio de su doctor(a), en una clnica privada, en un centro de atencin urgente o en una sala de emergencias.  Si tiene Engineer, drilling, por favor llame inmediatamente al 911 o vaya a la sala de emergencias.  Nmeros de bper  - Dr. Gwen Pounds: [phone]  - Dra. Roseanne Reno: [phone]  - Dr. Katrinka Blazing: [phone]   En caso de inclemencias del tiempo, por favor llame a Lacy Duverney principal al [phone] para una actualizacin sobre el [place] de cualquier retraso o cierre.  Consejos para la medicacin en dermatologa: Por favor, guarde las cajas en las que vienen los medicamentos de uso tpico para ayudarle a seguir las instrucciones sobre dnde y cmo usarlos. Las farmacias generalmente imprimen las instrucciones del medicamento slo en las cajas y no directamente en los tubos del [place].   Si su medicamento es muy caro, por favor, pngase en contacto con Rolm Gala llamando al [phone] y presione la  opcin 4 o envenos un mensaje a travs de Clinical cytogeneticist.   No podemos decirle cul ser su copago por los medicamentos por adelantado ya que esto es diferente dependiendo de la cobertura de su seguro. Sin embargo, es posible que podamos encontrar un medicamento sustituto a Audiological scientist un formulario para que el seguro cubra el medicamento que se considera necesario.   Si se requiere una autorizacin previa para que su compaa de seguros [place] su medicamento, por favor permtanos de 1 a 2 das hbiles para completar [address]  Los precios de los medicamentos varan con frecuencia dependiendo del Environmental consultant de dnde se surte la receta y alguna farmacias pueden ofrecer precios ms baratos.  El sitio web www.goodrx.com tiene cupones para medicamentos de Health and safety inspector. Los precios aqu no tienen en cuenta lo que podra costar con la ayuda del seguro (puede ser ms barato con su seguro), pero el sitio web puede darle el precio si no utiliz Tourist information centre manager.  - Puede imprimir el cupn correspondiente y llevarlo con su receta a la farmacia.  - Tambin puede pasar por nuestra oficina durante el horario de atencin regular y Education officer, museum una tarjeta de cupones de GoodRx.  - Si necesita que su receta se enve electrnicamente a una farmacia diferente, informe a nuestra oficina a travs de MyChart de  o por telfono llamando al [phone] y presione la opcin 4.

## 2024-01-28 ENCOUNTER — Ambulatory Visit: Admitting: Dermatology

## 2024-01-29 ENCOUNTER — Ambulatory Visit (INDEPENDENT_AMBULATORY_CARE_PROVIDER_SITE_OTHER): Payer: Self-pay | Admitting: Dermatology

## 2024-01-29 DIAGNOSIS — L988 Other specified disorders of the skin and subcutaneous tissue: Secondary | ICD-10-CM

## 2024-01-29 NOTE — Progress Notes (Signed)
   Follow-Up Visit   Subjective  Abigail Gross is a 63 y.o. female who presents for the following: Botox for facial elastosis  The following portions of the chart were reviewed this encounter and updated as appropriate: medications, allergies, medical history  Review of Systems:  No other skin or systemic complaints except as noted in HPI or Assessment and Plan.  Objective  Well appearing patient in no apparent distress; mood and affect are within normal limits.  A focused examination was performed of the face.  Relevant physical exam findings are noted in the Assessment and Plan.  Injection map photo     Assessment & Plan    Facial Elastosis  Botox 74 units injected today to: - Frown complex 20 units - Lat Brow lift 2.5 units x 2  - Forehead 6 units - Crows Feet 10 units x 2 - Bunny Lines 1 unit x 2 - Lateral Lip flip 4 units x 2 (4 units upper lip, 4 units lower lip) - DAO's 4 units x 2 - Chin 5 units  Location: See attached image  Informed consent: Discussed risks (infection, pain, bleeding, bruising, swelling, allergic reaction, paralysis of nearby muscles, eyelid droop, double vision, neck weakness, difficulty breathing, headache, undesirable cosmetic result, and need for additional treatment) and benefits of the procedure, as well as the alternatives.  Informed consent was obtained.  Preparation: The area was cleansed with alcohol.  Procedure Details:  Botox was injected into the dermis with a 30-gauge needle. Pressure applied to any bleeding. Ice packs offered for swelling.  Lot Number: [id number] Expiration:  03/2025  Total Units Injected:  74  Plan: Tylenol may be used for headache.  Allow 2 weeks before returning to clinic for additional dosing as needed. Patient will call for any problems.  Return in about 1 month (around 02/29/2024) for Botox Lip flip, then 3 mos for Botox full face.  IBernardine Bridegroom, CMA, am acting as scribe for Artemio Larry, MD  .   Documentation: I have reviewed the above documentation for accuracy and completeness, and I agree with the above.  Artemio Larry, MD

## 2024-01-29 NOTE — Patient Instructions (Signed)

## 2024-02-26 ENCOUNTER — Ambulatory Visit (INDEPENDENT_AMBULATORY_CARE_PROVIDER_SITE_OTHER): Payer: Self-pay | Admitting: Dermatology

## 2024-02-26 DIAGNOSIS — L988 Other specified disorders of the skin and subcutaneous tissue: Secondary | ICD-10-CM

## 2024-02-26 NOTE — Progress Notes (Addendum)
   Follow-Up Visit   Subjective  Abigail Gross is a 63 y.o. female who presents for the following: Botox for facial elastosis, 1 month lip flip. Patient has noticed recently that when she smiles, her lips are crooked. She also has a wrinkle above her brow that she would like injected.  The following portions of the chart were reviewed this encounter and updated as appropriate: medications, allergies, medical history  Review of Systems:  No other skin or systemic complaints except as noted in HPI or Assessment and Plan.  Objective  Well appearing patient in no apparent distress; mood and affect are within normal limits.  A focused examination was performed of the face.  Relevant physical exam findings are noted in the Assessment and Plan.  Injection map photo        Assessment & Plan    Facial Elastosis Botox 8 units injected today to: - Lateral lip flip upper lip 4 units - BL Brow lift 1 unit each site 1 cm above brow, BL comma 1 unit each site 1 cm above brow (start low, and may need to increase on f/up.)  Will not inject lower lip today due to uneven smile. Uneven smile could have resulted from toxin diffusion from DAO or mentalis injection to depressor labii inferioris on left side.  Will ensure DAO injection is just anterior to masseter muscle just above mandible and not too medial at next injection.  Also will inject mentalis injection more inferior.  Location: See attached image  Informed consent: Discussed risks (infection, pain, bleeding, bruising, swelling, allergic reaction, paralysis of nearby muscles, eyelid droop, double vision, neck weakness, difficulty breathing, headache, undesirable cosmetic result, and need for additional treatment) and benefits of the procedure, as well as the alternatives.  Informed consent was obtained.  Preparation: The area was cleansed with alcohol.  Procedure Details:  Botox was injected into the dermis with a 30-gauge needle.  Pressure applied to any bleeding. Ice packs offered for swelling.  Lot Number:  [id number] Expiration:  01/2026  Total Units Injected:  8  Plan: Tylenol may be used for headache.  Allow 2 weeks before returning to clinic for additional dosing as needed. Patient will call for any problems.  Return in about 1 month (around 03/27/2024) for Botox lip flip.  IBernardine Bridegroom, CMA, am acting as scribe for Artemio Larry, MD .   Documentation: I have reviewed the above documentation for accuracy and completeness, and I agree with the above.  Artemio Larry, MD

## 2024-02-26 NOTE — Patient Instructions (Signed)

## 2024-04-01 ENCOUNTER — Ambulatory Visit: Payer: Self-pay | Admitting: Dermatology

## 2024-05-04 ENCOUNTER — Ambulatory Visit (INDEPENDENT_AMBULATORY_CARE_PROVIDER_SITE_OTHER): Payer: Self-pay | Admitting: Dermatology

## 2024-05-04 ENCOUNTER — Encounter: Payer: Self-pay | Admitting: Dermatology

## 2024-05-04 DIAGNOSIS — L988 Other specified disorders of the skin and subcutaneous tissue: Secondary | ICD-10-CM

## 2024-05-04 NOTE — Progress Notes (Signed)
   Follow-Up Visit   Subjective  Abigail Gross is a 63 y.o. female who presents for the following: Botox for facial elastosis  The following portions of the chart were reviewed this encounter and updated as appropriate: medications, allergies, medical history  Review of Systems:  No other skin or systemic complaints except as noted in HPI or Assessment and Plan.  Objective  Well appearing patient in no apparent distress; mood and affect are within normal limits.  A focused examination was performed of the face.  Relevant physical exam findings are noted in the Assessment and Plan.  Injection map photo      Assessment & Plan    Facial Elastosis Botox 59 units medial lip flip upper lip: 4 units B/L botox comma: 2 units Frown complex: 20 units Forehead: 6 units. Crow's Feet: 20 units. Bunny lines: 2 units. Lat brow lift: 2.5 units x2  Did not inject in lower face since smile is still slightly asymmetric  Location: See attached image  Informed consent: Discussed risks (infection, pain, bleeding, bruising, swelling, allergic reaction, paralysis of nearby muscles, eyelid droop, double vision, neck weakness, difficulty breathing, headache, undesirable cosmetic result, and need for additional treatment) and benefits of the procedure, as well as the alternatives.  Informed consent was obtained.  Preparation: The area was cleansed with alcohol.  Procedure Details:  Botox was injected into the dermis with a 30-gauge needle. Pressure applied to any bleeding. Ice packs offered for swelling.  Lot Number:  [id number] Expiration:  01/2026  Total Units Injected:  59  Plan: Tylenol may be used for headache.  Allow 2 weeks before returning to clinic for additional dosing as needed. Patient will call for any problems.  Return in about 3 months (around 08/04/2024) for Botox, 1 month Botox lip flip.  I, Jill Parcell, CMA, am acting as scribe for Rexene Rattler, MD.   Documentation: I  have reviewed the above documentation for accuracy and completeness, and I agree with the above.  Rexene Rattler, MD

## 2024-05-04 NOTE — Patient Instructions (Signed)

## 2024-05-12 ENCOUNTER — Encounter: Payer: Self-pay | Admitting: Family Medicine

## 2024-05-15 ENCOUNTER — Other Ambulatory Visit: Payer: Self-pay | Admitting: Family Medicine

## 2024-05-15 DIAGNOSIS — Z1231 Encounter for screening mammogram for malignant neoplasm of breast: Secondary | ICD-10-CM

## 2024-05-28 NOTE — Progress Notes (Deleted)
 ANNUAL PREVENTATIVE CARE GYNECOLOGY  ENCOUNTER NOTE  SUBJECTIVE:       Abigail Gross is a 63 y.o. G2P2002 female here for a routine annual gynecologic exam. The patient {is/is not/has never been:13135} sexually active. The patient {is/is not:13135} taking hormone replacement therapy. {post-men bleed:13152::Patient denies post-menopausal vaginal bleeding.} Family history of breast, uterine, ovarian cancer: {yes/no:311178}. The patient wears seatbelts: {yes/no:311178}. The patient participates in regular exercise: {yes/no/not asked:9010}. Has the patient ever been transfused or tattooed?: {yes/no/not asked:9010}. The patient reports that there {is/is not:9024} domestic violence in her life. Has the patient completed the Gardasil vaccine? {yes/no:311178}.  Current complaints: 1.  ***    Gynecologic History Patient's last menstrual period was 08/24/2016 (exact date). Contraception: tubal ligation Last Pap: 09/28/15. Results were: normal History of abnormal pap: *** History of STIs: *** Last Mammogram: 06/17/23. Results were: normal Last Colonoscopy: 06/12/23 Last Dexa Scan:   PHQ-2:      No data to display          Obstetric History OB History  Gravida Para Term Preterm AB Living  2 2 2   2   SAB IAB Ectopic Multiple Live Births      2    # Outcome Date GA Lbr Len/2nd Weight Sex Type Anes PTL Lv  2 Term 1990   8 lb 6.4 oz (3.81 kg) M Vag-Spont   LIV  1 Term 1988   6 lb 1.6 oz (2.767 kg) F Vag-Spont   LIV    Past Medical History:  Diagnosis Date   Anxiety    Edema    Enlarged uterus    Fibroid    GERD (gastroesophageal reflux disease)    Hypertension    Insomnia    Ovarian cyst    Shingles     Family History  Problem Relation Age of Onset   Hypertension Mother    Hypertension Father    Breast cancer Maternal Aunt 40   Colon cancer Paternal Grandmother    Diabetes Paternal Grandfather    Heart disease Neg Hx    Ovarian cancer Neg Hx     Past Surgical  History:  Procedure Laterality Date   TUBAL LIGATION      Social History   Socioeconomic History   Marital status: Married    Spouse name: Not on file   Number of children: Not on file   Years of education: Not on file   Highest education level: Not on file  Occupational History   Not on file  Tobacco Use   Smoking status: Never   Smokeless tobacco: Never  Vaping Use   Vaping status: Never Used  Substance and Sexual Activity   Alcohol use: Yes    Alcohol/week: 1.0 standard drink of alcohol    Types: 1 Glasses of wine per week    Comment: 1 drink a day and more on weekedns   Drug use: No   Sexual activity: Yes    Partners: Male    Birth control/protection: Surgical    Comment: BTL  Other Topics Concern   Not on file  Social History Narrative   Not on file   Social Drivers of Health   Financial Resource Strain: Low Risk  (06/24/2023)   Received from [facility]   Overall Financial Resource Strain (CARDIA)    Difficulty of Paying Living Expenses: Not hard at all  Food Insecurity: No Food Insecurity (06/24/2023)   Received from [facility]   Hunger Vital Sign  Within the past 12 months, you worried that your food would run out before you got the money to buy more.: Never true    Within the past 12 months, the food you bought just didn't last and you didn't have money to get more.: Never true  Transportation Needs: No Transportation Needs (06/24/2023)   Received from [facility]   PRAPARE - Transportation    Lack of Transportation (Non-Medical): No    In the past 12 months, has lack of transportation kept you from medical appointments or from getting medications?: No  Physical Activity: Not on file  Stress: Not on file  Social Connections: Not on file  Intimate Partner Violence: Not on file    Current Outpatient Medications on File Prior to Visit  Medication Sig Dispense Refill   albuterol  (PROVENTIL   HFA;VENTOLIN  HFA) 108 (90 Base) MCG/ACT inhaler Inhale 2 puffs into the lungs every 6 (six) hours as needed for wheezing or shortness of breath. 8.5 Inhaler 1   ALPRAZolam (XANAX) 0.5 MG tablet      amoxicillin-clavulanate (AUGMENTIN) 875-125 MG tablet      benzonatate (TESSALON) 200 MG capsule      cefadroxil (DURICEF) 500 MG capsule TAKE 1 EVERY 12HRS     cetirizine (ZYRTEC) 10 MG tablet Take 10 mg by mouth as needed.      cetirizine (ZYRTEC) 10 MG tablet Take 1 tablet by mouth daily as needed.     Cholecalciferol (VITAMIN D3) 25 MCG (1000 UT) CAPS Take by mouth.     clonazePAM  (KLONOPIN ) 0.5 MG tablet TAKE 1 TABLET (0.5 MG TOTAL) BY MOUTH 2 (TWO) TIMES DAILY AS NEEDED FOR ANXIETY FOR UP TO 30 DAYS     [employer] ALLERGY RELIEF 25 MG capsule Take 25 mg by mouth every 6 (six) hours as needed.     diazepam (VALIUM) 5 MG tablet TAKE 1 TABLET 1 HOUR BEFORE SURGERY     doxycycline (VIBRAMYCIN) 100 MG capsule      DULoxetine (CYMBALTA) 30 MG capsule Take by mouth.     esomeprazole (NEXIUM) 40 MG capsule Take by mouth.     EUCRISA 2 % OINT Apply     as directed QD/BID to AA's for eczema     FLOVENT HFA 110 MCG/ACT inhaler SMARTSIG:1 Inhalation Via Inhaler Twice Daily     fluticasone (FLONASE) 50 MCG/ACT nasal spray SPRAY TWO SPRAYS IN EACH NOSTRIL ONCE DAILY     influenza vac split quadrivalent PF (FLUARIX) 0.5 ML injection TO BE ADMINISTERED BY PHARMACIST FOR IMMUNIZATION     meclizine  (ANTIVERT ) 12.5 MG tablet TAKE 1 TABLET BY MOUTH 3 TIMES DAILY AS NEEDED FOR DIZZINESS.     meloxicam (MOBIC) 15 MG tablet Take 1 tablet by mouth daily as needed.     methylPREDNISolone (MEDROL DOSEPAK) 4 MG TBPK tablet Take by mouth as directed.     MIMVEY 1-0.5 MG tablet TAKE 1 TABLET BY MOUTH EVERY DAY 28 tablet 2   mometasone furoate (ASMANEX, 30 METERED DOSES,) 110 MCG/ACT AEPB      montelukast (SINGULAIR) 10 MG tablet Take 1 tablet by mouth daily.     oxyCODONE-acetaminophen (PERCOCET/ROXICET) 5-325 MG tablet TAKE 1  EVERY 6 HOURS FOR POST OPERATION PAIN     potassium gluconate 595 (99 K) MG TABS tablet Take by mouth.     promethazine (PHENERGAN) 25 MG tablet TAKE 1 TABLET 1 HR. BEFORE SURGERY/TAKE 1 TABLET EVERY 4HOURS AS NEEDED FOR NAUSEA AND VOMITING     tobramycin (TOBREX)  0.3 % ophthalmic solution PLACE 1 DROP IN INTO RIGHT EYE 4 TIMES A DAY FOR 7 DAYS     triamcinolone  (KENALOG ) 0.1 % paste Apply once or twice daily to dry ulcers 5 g 5   triamterene-hydrochlorothiazide (MAXZIDE-25) 37.5-25 MG per tablet Take 1 tablet by mouth daily.      valACYclovir (VALTREX) 1000 MG tablet 1,000 mg as needed.   2   valACYclovir (VALTREX) 1000 MG tablet Take 1 tablet by mouth 2 (two) times daily.     valsartan-hydrochlorothiazide (DIOVAN-HCT) 160-25 MG tablet      zolpidem (AMBIEN) 10 MG tablet Take 10 mg by mouth at bedtime as needed for sleep.     Zoster Vaccine Adjuvanted [facility]) injection      No current facility-administered medications on file prior to visit.    Allergies  Allergen Reactions   Sulfa Antibiotics Hives     Review of Systems ROS Review of Systems - General ROS: negative for - chills, fatigue, fever, hot flashes, night sweats, weight gain or weight loss Psychological ROS: negative for - anxiety, decreased libido, depression, mood swings, physical abuse or sexual abuse Ophthalmic ROS: negative for - blurry vision, eye pain or loss of vision ENT ROS: negative for - headaches, hearing change, visual changes or vocal changes Allergy and Immunology ROS: negative for - hives, itchy/watery eyes or seasonal allergies Hematological and Lymphatic ROS: negative for - bleeding problems, bruising, swollen lymph nodes or weight loss Endocrine ROS: negative for - galactorrhea, hair pattern changes, hot flashes, malaise/lethargy, mood swings, palpitations, polydipsia/polyuria, skin changes, temperature intolerance or unexpected weight changes Breast ROS: negative for - new or changing breast lumps or  nipple discharge Respiratory ROS: negative for - cough or shortness of breath Cardiovascular ROS: negative for - chest pain, irregular heartbeat, palpitations or shortness of breath Gastrointestinal ROS: no abdominal pain, change in bowel habits, or black or bloody stools Genito-Urinary ROS: no dysuria, trouble voiding, or hematuria Musculoskeletal ROS: negative for - joint pain or joint stiffness Neurological ROS: negative for - bowel and bladder control changes Dermatological ROS: negative for rash and skin lesion changes   OBJECTIVE:   LMP 08/24/2016 (Exact Date)   CONSTITUTIONAL: Well-developed, well-nourished female in no acute distress.  PSYCHIATRIC: Normal mood and affect. Normal behavior. Normal judgment and thought content. NEUROLGIC: Alert and oriented to person, place, and time. Normal muscle tone coordination. No cranial nerve deficit noted. HENT:  Normocephalic, atraumatic, External right and left ear normal. Oropharynx is clear and moist EYES: Conjunctivae and EOM are normal. No scleral icterus.  NECK: Normal range of motion, supple, no masses.  Normal thyroid.  SKIN: Skin is warm and dry. No rash noted. Not diaphoretic. No erythema. No pallor. CARDIOVASCULAR: Normal heart rate noted, regular rhythm, no murmur. RESPIRATORY: Clear to auscultation bilaterally. Effort and breath sounds normal, no problems with respiration noted. BREASTS: Symmetric in size. No masses, skin changes, nipple drainage, or lymphadenopathy. ABDOMEN: Soft, normal bowel sounds, no distention noted.  No tenderness, rebound or guarding.  PELVIC:  Bladder {:311640}  Urethra: {:311719}  Vulva: {:311722}  Vagina: {:311643}  Cervix: {:311644}  Uterus: {:311718}  Adnexa: {:311645}  RV: {Blank multiple:19196::External Exam NormaI,No Rectal Masses,Normal Sphincter tone}  MUSCULOSKELETAL: Normal range of motion. No tenderness.  No cyanosis, clubbing, or edema.  2+ distal pulses. LYMPHATIC: No  Axillary, Supraclavicular, or Inguinal Adenopathy.  Labs: No results found for: WBC, HGB, HCT, MCV, PLT  No results found for: CREATININE, BUN, NA, K, CL, CO2  No results found  for: ALT, AST, GGT, ALKPHOS, BILITOT  No results found for: CHOL, HDL, LDLCALC, LDLDIRECT, TRIG, CHOLHDL  No results found for: TSH  No results found for: HGBA1C   ASSESSMENT:   No diagnosis found.   PLAN:   QUINTASHA GREN is a 63 y.o. G75P2002 female here today for her annual exam, doing well.  Pap: done with cotesting today Mammogram: ordered due 05/2024 Colon: PCP *** ordered colonoscopy***Cologuard -OR- due *** Labs: ***A1C, CMP, HepC, Lipid panel, Vit D, TSH PHQ-2 = ***, discussed coping techniques; RTC if worsens or develops concern Contraception: tubal Healthy lifestyle modifications discussed: multivitamin, diet, exercise, sunscreen, tobacco and alcohol use. Emphasized importance of regular physical activity.  Calcium and Vit D recommendation reviewed.  All questions answered to patient's satisfaction.   Follow up 1 yr for annual, sooner prn.    Estil Mangle, DO [facility] at [facility]

## 2024-06-01 ENCOUNTER — Ambulatory Visit (INDEPENDENT_AMBULATORY_CARE_PROVIDER_SITE_OTHER): Payer: Self-pay | Admitting: Advanced Practice Midwife

## 2024-06-01 ENCOUNTER — Encounter: Payer: Self-pay | Admitting: Advanced Practice Midwife

## 2024-06-01 VITALS — BP 155/81 | HR 55 | Ht 62.75 in | Wt 135.3 lb

## 2024-06-01 DIAGNOSIS — R109 Unspecified abdominal pain: Secondary | ICD-10-CM | POA: Diagnosis not present

## 2024-06-01 DIAGNOSIS — R102 Pelvic and perineal pain: Secondary | ICD-10-CM

## 2024-06-01 NOTE — Progress Notes (Signed)
 Patient ID: Abigail Gross, female   DOB: 1960/12/12, 63 y.o.   MRN: [id number]  Reason for Visit: Bloated   Subjective:  HPI:  Abigail Gross is a 63 y.o. female who presents in office today with complaints of feeling bloated for the past several months. Patient denies dietary changes or changes to her medications, patient denies history of GI problems or abdominal surgeries. Past imaging and visit notes reviewed. She describes the symptoms as being aware of her lower abdomen, and a feeling of fullness, bloating and tight. She denies pain. Reports her mother advised her to be watchful of gyn cancer symptoms due to something that she took while she was pregnant (with the patient). She is unsure if this was DES.   Discussion regarding length of time since last imaging, history of small fibroids, normal CA 125. Offered exam and repeat imaging today. Some pelvic area tenderness noted on exam. Reassurance given of essentially normal exam otherwise. Will follow up after imaging. Also suggested possible GI etiology and may benefit from gastroenterology evaluation.   Past Medical History:  Diagnosis Date   Anxiety    Edema    Enlarged uterus    Fibroid    GERD (gastroesophageal reflux disease)    Hypertension    Insomnia    Ovarian cyst    Shingles    Family History  Problem Relation Age of Onset   Hypertension Mother    Hypertension Father    Breast cancer Maternal Aunt 33   Colon cancer Paternal Grandmother    Diabetes Paternal Grandfather    Heart disease Neg Hx    Ovarian cancer Neg Hx    Past Surgical History:  Procedure Laterality Date   TUBAL LIGATION      Short Social History:  Social History   Tobacco Use   Smoking status: Never   Smokeless tobacco: Never  Substance Use Topics   Alcohol use: Yes    Alcohol/week: 1.0 standard drink of alcohol    Types: 1 Glasses of wine per week    Comment: 1 drink a day and more on weekedns    Allergies  Allergen  Reactions   Sulfa Antibiotics Hives    Current Outpatient Medications  Medication Sig Dispense Refill   albuterol  (PROVENTIL  HFA;VENTOLIN  HFA) 108 (90 Base) MCG/ACT inhaler Inhale 2 puffs into the lungs every 6 (six) hours as needed for wheezing or shortness of breath. 8.5 Inhaler 1   amoxicillin-clavulanate (AUGMENTIN) 875-125 MG tablet      cetirizine (ZYRTEC) 10 MG tablet Take 10 mg by mouth as needed.      DULoxetine (CYMBALTA) 60 MG capsule Take 120 mg by mouth daily.     EUCRISA 2 % OINT Apply     as directed QD/BID to AA's for eczema     fluticasone (FLONASE) 50 MCG/ACT nasal spray SPRAY TWO SPRAYS IN EACH NOSTRIL ONCE DAILY     montelukast (SINGULAIR) 10 MG tablet Take 1 tablet by mouth daily.     potassium gluconate 595 (99 K) MG TABS tablet Take by mouth.     triamterene-hydrochlorothiazide (MAXZIDE-25) 37.5-25 MG per tablet Take 1 tablet by mouth daily.      valACYclovir (VALTREX) 1000 MG tablet 1,000 mg as needed.   2   valACYclovir (VALTREX) 1000 MG tablet Take 1 tablet by mouth 2 (two) times daily.     zolpidem (AMBIEN) 10 MG tablet Take 10 mg by mouth at bedtime as needed for sleep.  Zoster Vaccine Adjuvanted [facility] injection      No current facility-administered medications for this visit.    Review of Systems  Constitutional:  Negative for chills and fever.  HENT:  Negative for congestion, ear discharge, ear pain, hearing loss, sinus pain and sore throat.   Eyes:  Negative for blurred vision and double vision.  Respiratory:  Negative for cough, shortness of breath and wheezing.   Cardiovascular:  Negative for chest pain, palpitations and leg swelling.  Gastrointestinal:  Positive for abdominal pain. Negative for blood in stool, constipation, diarrhea, heartburn, melena, nausea and vomiting.  Genitourinary:  Negative for dysuria, flank pain, frequency, hematuria and urgency.  Musculoskeletal:  Negative for back pain, joint pain and myalgias.  Skin:  Negative for  itching and rash.  Neurological:  Negative for dizziness, tingling, tremors, sensory change, speech change, focal weakness, seizures, loss of consciousness, weakness and headaches.  Endo/Heme/Allergies:  Negative for environmental allergies. Does not bruise/bleed easily.  Psychiatric/Behavioral:  Negative for depression, hallucinations, memory loss, substance abuse and suicidal ideas. The patient is not nervous/anxious and does not have insomnia.        Objective:   Vital Signs: BP (!) 155/81   Pulse (!) 55   Ht 5' 2.75 (1.594 m)   Wt 135 lb 4.8 oz (61.4 kg)   LMP 08/24/2016 (Exact Date)   BMI 24.16 kg/m  Constitutional: Well nourished, well developed female in no acute distress.  HEENT: normal Skin: Warm and dry.  Cardiovascular: Regular rate and rhythm.   Respiratory:  Normal respiratory effort Abdomen: soft, nontender, nondistended, no abnormal masses, no epigastric pain, mildly supra pubic tenderness. Diastasis 2 FB Psych: Alert and Oriented x3. No memory deficits. Normal mood and affect.    Pelvic exam:  is not limited by body habitus EGBUS: within normal limits Vagina: within normal limits and with normal mucosa, moderate muscle strength, no evidence of prolapse  Cervix: mildly tender to palpation Uterus: tenderness, slightly enlarged Adnexa: mildly tender on right, not enlarged   Assessment/Plan:     63 y.o. G2 P70 female with abdominal/pelvic discomfort, bloating  Gyn ultrasound Follow up after    Slater Rains CNM

## 2024-06-02 ENCOUNTER — Encounter: Payer: Self-pay | Admitting: Dermatology

## 2024-06-02 ENCOUNTER — Ambulatory Visit (INDEPENDENT_AMBULATORY_CARE_PROVIDER_SITE_OTHER): Payer: Self-pay | Admitting: Dermatology

## 2024-06-02 ENCOUNTER — Ambulatory Visit: Payer: Self-pay | Admitting: Obstetrics

## 2024-06-02 DIAGNOSIS — L988 Other specified disorders of the skin and subcutaneous tissue: Secondary | ICD-10-CM

## 2024-06-02 NOTE — Patient Instructions (Signed)

## 2024-06-02 NOTE — Progress Notes (Signed)
   Follow-Up Visit   Subjective  Abigail Gross is a 63 y.o. 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  Objective  Well appearing patient in no apparent distress; mood and affect are within normal limits.  A focused examination was performed of the face.  Relevant physical exam findings are noted in the Assessment and Plan.    Assessment & Plan    Facial Elastosis Location: See attached image Lateral upper lip flip   Informed consent: Discussed risks (infection, pain, bleeding, bruising, swelling, allergic reaction, paralysis of nearby muscles, eyelid droop, double vision, neck weakness, difficulty breathing, headache, undesirable cosmetic result, and need for additional treatment) and benefits of the procedure, as well as the alternatives.  Informed consent was obtained.  Preparation: The area was cleansed with alcohol.  Procedure Details:  Botox was injected into the dermis with a 30-gauge needle. Pressure applied to any bleeding. Ice packs offered for swelling.  Lot Number:  [id number] Expiration:  01/2026  Total Units Injected:  4  Plan: Tylenol may be used for headache.  Allow 2 weeks before returning to clinic for additional dosing as needed. Patient will call for any problems.  Return if symptoms worsen or fail to improve.  IFay Kirks, CMA, am acting as scribe for Alm Rhyme, MD .   Documentation: I have reviewed the above documentation for accuracy and completeness, and I agree with the above.  Alm Rhyme, MD

## 2024-06-17 ENCOUNTER — Ambulatory Visit
Admission: RE | Admit: 2024-06-17 | Discharge: 2024-06-17 | Disposition: A | Discharge status: Home / Self Care | Payer: Self-pay | Source: Ambulatory Visit | Attending: Family Medicine | Admitting: Family Medicine

## 2024-06-17 ENCOUNTER — Ambulatory Visit (INDEPENDENT_AMBULATORY_CARE_PROVIDER_SITE_OTHER)

## 2024-06-17 DIAGNOSIS — R102 Pelvic and perineal pain: Secondary | ICD-10-CM | POA: Diagnosis not present

## 2024-06-17 DIAGNOSIS — Z1231 Encounter for screening mammogram for malignant neoplasm of breast: Secondary | ICD-10-CM | POA: Diagnosis present

## 2024-06-18 NOTE — Progress Notes (Signed)
 ANNUAL PREVENTATIVE CARE GYNECOLOGY  ENCOUNTER NOTE  SUBJECTIVE:       Abigail Gross is a 63 y.o. G33P2002 female here for a routine annual gynecologic exam. The patient is sexually active. The patient is not taking hormone replacement therapy. Patient reports post-menopausal vaginal bleeding. Bleeding is not associated with pain. Family history of breast, uterine, ovarian cancer: yes. The patient wears seatbelts: yes. The patient participates in regular exercise: yes. Has the patient ever been transfused or tattooed?: yes. The patient reports that there is not domestic violence in her life. Has the patient completed the Gardasil vaccine? no.  Current complaints: 1.  US  results and previous treatment by Dr. Janit.    Gynecologic History Patient's last menstrual period was 08/24/2016 (exact date). Contraception: postmenopausal Last Pap: 09/28/15. Results were: normal History of abnormal pap: none History of STIs: none Last Mammogram: 06/17/23. Results were: normal Last Colonoscopy: 06/12/23 Last Dexa Scan: N/A  Obstetric History OB History  Gravida Para Term Preterm AB Living  2 2 2   2   SAB IAB Ectopic Multiple Live Births      2    # Outcome Date GA Lbr Len/2nd Weight Sex Type Anes PTL Lv  2 Term 1990   8 lb 6.4 oz (3.81 kg) M Vag-Spont   LIV  1 Term 1988   6 lb 1.6 oz (2.767 kg) F Vag-Spont   LIV    Past Medical History:  Diagnosis Date   Anxiety    Edema    Enlarged uterus    Fibroid    GERD (gastroesophageal reflux disease)    Hypertension    Insomnia    Ovarian cyst    Shingles     Family History  Problem Relation Age of Onset   Hypertension Mother    Hypertension Father    Breast cancer Maternal Aunt 57   Colon cancer Paternal Grandmother    Diabetes Paternal Grandfather    Heart disease Neg Hx    Ovarian cancer Neg Hx     Past Surgical History:  Procedure Laterality Date   TUBAL LIGATION      Social History   Socioeconomic History   Marital  status: Married    Spouse name: Not on file   Number of children: Not on file   Years of education: Not on file   Highest education level: Not on file  Occupational History   Not on file  Tobacco Use   Smoking status: Never   Smokeless tobacco: Never  Vaping Use   Vaping status: Never Used  Substance and Sexual Activity   Alcohol use: Yes    Alcohol/week: 1.0 standard drink of alcohol    Types: 1 Glasses of wine per week    Comment: 1 drink a day and more on weekedns   Drug use: No   Sexual activity: Yes    Partners: Male    Birth control/protection: Surgical    Comment: BTL  Other Topics Concern   Not on file  Social History Narrative   Not on file   Social Drivers of Health   Financial Resource Strain: Low Risk  (06/24/2023)   Received from [facility]   Overall Financial Resource Strain (CARDIA)    Difficulty of Paying Living Expenses: Not hard at all  Food Insecurity: No Food Insecurity (06/24/2023)   [facility]   Hunger Vital Sign    Within the past 12 months, you worried that your food  would run out before you got the money to buy more.: Never true    Within the past 12 months, the food you bought just didn't last and you didn't have money to get more.: Never true  Transportation Needs: No Transportation Needs (06/24/2023)   Received from [facility]   PRAPARE - Transportation    Lack of Transportation (Non-Medical): No    In the past 12 months, has lack of transportation kept you from medical appointments or from getting medications?: No  Physical Activity: Not on file  Stress: Not on file  Social Connections: Not on file  Intimate Partner Violence: Not on file    Current Outpatient Medications on File Prior to Visit  Medication Sig Dispense Refill   albuterol  (PROVENTIL  HFA;VENTOLIN  HFA) 108 (90 Base) MCG/ACT inhaler Inhale 2 puffs into the lungs every 6 (six) hours as needed for wheezing or  shortness of breath. 8.5 Inhaler 1   cetirizine (ZYRTEC) 10 MG tablet Take 10 mg by mouth as needed.      DULoxetine (CYMBALTA) 60 MG capsule Take 120 mg by mouth daily.     fluticasone (FLONASE) 50 MCG/ACT nasal spray SPRAY TWO SPRAYS IN EACH NOSTRIL ONCE DAILY     potassium gluconate 595 (99 K) MG TABS tablet Take by mouth.     triamterene-hydrochlorothiazide (MAXZIDE-25) 37.5-25 MG per tablet Take 1 tablet by mouth daily.      valACYclovir (VALTREX) 1000 MG tablet 1,000 mg as needed.   2   valACYclovir (VALTREX) 1000 MG tablet Take 1 tablet by mouth 2 (two) times daily.     zolpidem (AMBIEN) 10 MG tablet Take 10 mg by mouth at bedtime as needed for sleep.     amoxicillin-clavulanate (AUGMENTIN) 875-125 MG tablet  (Patient not taking: Reported on 06/23/2024)     EUCRISA 2 % OINT Apply     as directed QD/BID to AA's for eczema (Patient not taking: Reported on 06/23/2024)     montelukast (SINGULAIR) 10 MG tablet Take 1 tablet by mouth daily. (Patient not taking: Reported on 06/23/2024)     Zoster Vaccine Adjuvanted [facility]) injection  (Patient not taking: Reported on 06/23/2024)     No current facility-administered medications on file prior to visit.    Allergies  Allergen Reactions   Sulfa Antibiotics Hives     Review of Systems ROS Review of Systems - General ROS: negative for - chills, fatigue, fever, hot flashes, night sweats, weight gain or weight loss Psychological ROS: negative for - anxiety, decreased libido, depression, mood swings, physical abuse or sexual abuse Ophthalmic ROS: negative for - blurry vision, eye pain or loss of vision ENT ROS: negative for - headaches, hearing change, visual changes or vocal changes Allergy and Immunology ROS: negative for - hives, itchy/watery eyes or seasonal allergies Hematological and Lymphatic ROS: negative for - bleeding problems, bruising, swollen lymph nodes or weight loss Endocrine ROS: negative for - galactorrhea, hair pattern changes,  hot flashes, malaise/lethargy, mood swings, palpitations, polydipsia/polyuria, skin changes, temperature intolerance or unexpected weight changes Breast ROS: negative for - new or changing breast lumps or nipple discharge Respiratory ROS: negative for - cough or shortness of breath Cardiovascular ROS: negative for - chest pain, irregular heartbeat, palpitations or shortness of breath Gastrointestinal ROS: no abdominal pain, change in bowel habits, or black or bloody stools Genito-Urinary ROS: no dysuria, trouble voiding, or hematuria Musculoskeletal ROS: negative for - joint pain or joint stiffness Neurological ROS: negative for - bowel and bladder control  changes Dermatological ROS: negative for rash and skin lesion changes   OBJECTIVE:   BP (!) 148/93   Pulse 88   Ht 5' 3 (1.6 m)   Wt 133 lb 9.6 oz (60.6 kg)   LMP 08/24/2016 (Exact Date)   BMI 23.67 kg/m   CONSTITUTIONAL: Well-developed, well-nourished female in no acute distress.  PSYCHIATRIC: Normal mood and affect. Normal behavior. Normal judgment and thought content. NEUROLGIC: Alert and oriented to person, place, and time. Normal muscle tone coordination. No cranial nerve deficit noted. HENT:  Normocephalic, atraumatic, External right and left ear normal. Oropharynx is clear and moist EYES: Conjunctivae and EOM are normal. No scleral icterus.  NECK: Normal range of motion, supple, no masses.  Normal thyroid.  SKIN: Skin is warm and dry. No rash noted. Not diaphoretic. No erythema. No pallor. CARDIOVASCULAR: Normal heart rate noted, regular rhythm, no murmur. RESPIRATORY: Clear to auscultation bilaterally. Effort and breath sounds normal, no problems with respiration noted. BREASTS: Symmetric in size. No masses, skin changes, nipple drainage, or lymphadenopathy. ABDOMEN: Soft, normal bowel sounds, no distention noted.  No tenderness, rebound or guarding.  PELVIC:  Bladder no bladder distension noted  Urethra: normal appearing  urethra with no masses, tenderness or lesions  Vulva: normal appearing vulva with no masses, tenderness or lesions  Vagina: normal appearing vagina with normal color and discharge, no lesions  Cervix: normal appearing cervix without discharge or lesions and friable to pap broom  Uterus: uterus is normal size, shape, consistency and nontender  Adnexa: normal adnexa in size, nontender and no masses  RV: External Exam NormaI, No Rectal Masses, and Normal Sphincter tone  MUSCULOSKELETAL: Normal range of motion. No tenderness.  No cyanosis, clubbing, or edema.  2+ distal pulses. LYMPHATIC: No Axillary, Supraclavicular, or Inguinal Adenopathy.   ASSESSMENT:   1. Well woman exam   2. Encounter for hepatitis C screening test for low risk patient   3. Encounter for immunization      PLAN:   PAYSON CRUMBY is a 63 y.o. G59P2002 female here today for her annual exam, doing well. Of note, pt's BP 148/93 today, PMH of HTN, asx.  Pap: done with cotesting today Mammogram: done 06/17/24 Colon: PCP Labs: CBC, CMP, TSH, Vit D, Hep C PHQ-2 = 8, discussed coping techniques; discuss with PCP if worsens or develops concern Contraception: tubal Flu vaccine given today Healthy lifestyle modifications discussed: multivitamin, diet, exercise, sunscreen, tobacco and alcohol use. Emphasized importance of regular physical activity.  Calcium and Vit D recommendation reviewed.  All questions answered to patient's satisfaction.   Follow up 1 yr for annual, sooner prn.   Discussion re: US  results and delay in finalizing reports. Informed pt, as others previously have, that there is a backup due to having to send our Listened to pt's concerns re: Dr. Janit and prior experiences, will of course see her for follow up on her pelvic pain, but need US  report to be finalized first. She is understanding of this. Recommend she schedule follow up in 2-3 weeks, and if report not finalized, will notify and reschedule.     Estil Mangle, DO [facility] at [facility]

## 2024-06-22 ENCOUNTER — Telehealth: Payer: Self-pay | Admitting: Advanced Practice Midwife

## 2024-06-22 NOTE — Telephone Encounter (Addendum)
 Called and LVM for patient to call back.  We will need to reschedule after 07/01/2024 with Slater, hopefully the ultrasound result will be back then.  If patient wants to keep her annual Dr. Leigh will see her.  If she is wanting her results  she will need to see Slater after 07/01/2024.

## 2024-06-23 ENCOUNTER — Ambulatory Visit: Payer: Self-pay | Admitting: Obstetrics

## 2024-06-23 ENCOUNTER — Encounter: Payer: Self-pay | Admitting: Obstetrics

## 2024-06-23 ENCOUNTER — Other Ambulatory Visit (HOSPITAL_COMMUNITY)
Admission: RE | Admit: 2024-06-23 | Discharge: 2024-06-23 | Disposition: A | Discharge status: Home / Self Care | Source: Ambulatory Visit | Attending: Obstetrics | Admitting: Obstetrics

## 2024-06-23 VITALS — BP 148/93 | HR 88 | Ht 63.0 in | Wt 133.6 lb

## 2024-06-23 DIAGNOSIS — Z01419 Encounter for gynecological examination (general) (routine) without abnormal findings: Secondary | ICD-10-CM | POA: Insufficient documentation

## 2024-06-23 DIAGNOSIS — Z23 Encounter for immunization: Secondary | ICD-10-CM | POA: Diagnosis not present

## 2024-06-23 DIAGNOSIS — Z1159 Encounter for screening for other viral diseases: Secondary | ICD-10-CM | POA: Diagnosis not present

## 2024-06-23 DIAGNOSIS — Z1331 Encounter for screening for depression: Secondary | ICD-10-CM | POA: Diagnosis not present

## 2024-06-24 ENCOUNTER — Ambulatory Visit: Payer: Self-pay | Admitting: Obstetrics

## 2024-06-24 LAB — COMPREHENSIVE METABOLIC PANEL WITH GFR
ALT: 22 IU/L (ref 0–32)
AST: 28 IU/L (ref 0–40)
Albumin: 4.5 g/dL (ref 3.9–4.9)
Alkaline Phosphatase: 74 IU/L (ref 49–135)
BUN/Creatinine Ratio: 23 (ref 12–28)
BUN: 20 mg/dL (ref 8–27)
Bilirubin Total: 0.5 mg/dL (ref 0.0–1.2)
CO2: 24 mmol/L (ref 20–29)
Calcium: 10.5 mg/dL — ABNORMAL HIGH (ref 8.7–10.3)
Chloride: 97 mmol/L (ref 96–106)
Creatinine, Ser: 0.87 mg/dL (ref 0.57–1.00)
Globulin, Total: 2.4 g/dL (ref 1.5–4.5)
Glucose: 76 mg/dL (ref 70–99)
Potassium: 4.5 mmol/L (ref 3.5–5.2)
Sodium: 137 mmol/L (ref 134–144)
Total Protein: 6.9 g/dL (ref 6.0–8.5)
eGFR: 75 mL/min/1.73 (ref >59)

## 2024-06-24 LAB — CBC
Hematocrit: 44.3 % (ref 34.0–46.6)
Hemoglobin: 14.8 g/dL (ref 11.1–15.9)
MCH: 32 pg (ref 26.6–33.0)
MCHC: 33.4 g/dL (ref 31.5–35.7)
MCV: 96 fL (ref 79–97)
Platelets: 294 x10E3/uL (ref 150–450)
RBC: 4.63 x10E6/uL (ref 3.77–5.28)
RDW: 13.2 % (ref 11.7–15.4)
WBC: 6.4 x10E3/uL (ref 3.4–10.8)

## 2024-06-24 LAB — VITAMIN D 25 HYDROXY (VIT D DEFICIENCY, FRACTURES): Vit D, 25-Hydroxy: 77.5 ng/mL (ref 30.0–100.0)

## 2024-06-24 LAB — TSH RFX ON ABNORMAL TO FREE T4: TSH: 1.65 u[IU]/mL (ref 0.450–4.500)

## 2024-06-24 LAB — HEPATITIS C ANTIBODY: Hep C Virus Ab: NONREACTIVE

## 2024-06-25 LAB — CYTOLOGY - PAP
Comment: NEGATIVE
Comment: NEGATIVE
Comment: NEGATIVE
Diagnosis: UNDETERMINED — AB
HPV 16: NEGATIVE
HPV 18 / 45: NEGATIVE
High risk HPV: POSITIVE — AB

## 2024-06-30 ENCOUNTER — Ambulatory Visit (INDEPENDENT_AMBULATORY_CARE_PROVIDER_SITE_OTHER): Payer: Self-pay | Admitting: Dermatology

## 2024-06-30 DIAGNOSIS — L84 Corns and callosities: Secondary | ICD-10-CM | POA: Diagnosis not present

## 2024-06-30 DIAGNOSIS — R21 Rash and other nonspecific skin eruption: Secondary | ICD-10-CM

## 2024-06-30 DIAGNOSIS — L821 Other seborrheic keratosis: Secondary | ICD-10-CM | POA: Diagnosis not present

## 2024-06-30 DIAGNOSIS — L988 Other specified disorders of the skin and subcutaneous tissue: Secondary | ICD-10-CM

## 2024-06-30 DIAGNOSIS — L299 Pruritus, unspecified: Secondary | ICD-10-CM

## 2024-06-30 DIAGNOSIS — M2040 Other hammer toe(s) (acquired), unspecified foot: Secondary | ICD-10-CM

## 2024-06-30 NOTE — Patient Instructions (Addendum)
 For toenail area  Recommend otc urea 40 % cream - apply to toe areas daily for 6 weeks to help with roughness    For a week   For  Itch: Start non sedating antihistamine (either Allegra 180mg , or Claritin 10mg , or Zyrtec 10mg ) daily.  All these are non-prescription (Over the Counter).   Start out with 1 pill a day.   After a week if not improving may increase to 2 pills a day.   After another week if not improving may increase to 3 pills a day.   After another week if still not improving may take up to 4 pills a day. Stay at highest dose that keeps condition controlled, but only up to 4 pills a day. Stay at the controlling dose for at least 2 weeks. Contact office if taking 4 pills of antihistamine a day for at least 1 weeks without control of condition as other options may be available.  Contact office if you are still itchy after a week will consider injection    Due to recent changes in healthcare laws, you may see results of your pathology and/or laboratory studies on MyChart before the doctors have had a chance to review them. We understand that in some cases there may be results that are confusing or concerning to you. Please understand that not all results are received at the same time and often the doctors may need to interpret multiple results in order to provide you with the best plan of care or course of treatment. Therefore, we ask that you please give us  2 business days to thoroughly review all your results before contacting the office for clarification. Should we see a critical lab result, you will be contacted sooner.   If You Need Anything After Your Visit  If you have any questions or concerns for your doctor, please call our main line at [phone] and press option 4 to reach your doctor's medical assistant. If no one answers, please leave a voicemail as directed and we will return your call as soon as possible. Messages left after 4 pm will be answered the following  business day.   You may also send us  a message via MyChart. We typically respond to MyChart messages within 1-2 business days.  For prescription refills, please ask your pharmacy to contact our office. Our fax number is [phone].  If you have an urgent issue when the clinic is closed that cannot wait until the next business day, you can page your doctor at the number below.    Please note that while we do our best to be available for urgent issues outside of office hours, we are not available 24/7.   If you have an urgent issue and are unable to reach us , you may choose to seek medical care at your doctor's office, retail clinic, urgent care center, or emergency room.  If you have a medical emergency, please immediately call 911 or go to the emergency department.  Pager Numbers  - Dr. Hester: [phone]  - Dr. Jackquline: [phone]  - Dr. Claudene: [phone]   - Dr. Raymund: [phone]  In the event of inclement weather, please call our main line at [phone] for an update on the status of any delays or closures.  Dermatology Medication Tips: Please keep the boxes that topical medications come in in order to help keep track of the instructions about where and how to use these. Pharmacies typically print the medication instructions only on the boxes and  not directly on the medication tubes.   If your medication is too expensive, please contact our office at [phone] option 4 or send us  a message through MyChart.   We are unable to tell what your co-pay for medications will be in advance as this is different depending on your insurance coverage. However, we may be able to find a substitute medication at lower cost or fill out paperwork to get insurance to cover a needed medication.   If a prior authorization is required to get your medication covered by your insurance company, please allow us  1-2 business days to complete this process.  Drug prices often vary depending  on where the prescription is filled and some pharmacies may offer cheaper prices.  The website www.goodrx.com contains coupons for medications through different pharmacies. The prices here do not account for what the cost may be with help from insurance (it may be cheaper with your insurance), but the website can give you the price if you did not use any insurance.  - You can print the associated coupon and take it with your prescription to the pharmacy.  - You may also stop by our office during regular business hours and pick up a GoodRx coupon card.  - If you need your prescription sent electronically to a different pharmacy, notify our office through [facility] or by phone at [phone] option 4.     Si Usted Necesita Algo Despus de Su Visita  Tambin puede enviarnos un mensaje a travs de Clinical cytogeneticist. Por lo general respondemos a los mensajes de MyChart en el transcurso de 1 a 2 das hbiles.  Para renovar recetas, por favor pida a su farmacia que se ponga en contacto con nuestra oficina. Randi lakes de fax es [place] [phone].  Si tiene un asunto urgente cuando la clnica est cerrada y que no puede esperar hasta el siguiente da hbil, puede llamar/localizar a su doctor(a) al nmero que aparece a continuacin.   Por favor, tenga en cuenta que aunque hacemos todo lo posible para estar disponibles para asuntos urgentes fuera del horario de [place], no estamos disponibles las 24 horas del da, los 7 [address]  [address] de la [place].   Si tiene un problema urgente y no puede comunicarse con nosotros, puede optar por buscar atencin mdica  en el consultorio de su doctor(a), en una clnica privada, en un centro de atencin urgente o en una sala de emergencias.  Si tiene Engineer, drilling, por favor llame inmediatamente al 911 o vaya a la sala de emergencias.  Nmeros de bper  - Dr. Hester: [phone]  - Dra. Jackquline: [phone]  - Dr. Claudene: [phone]  - Dra. Kitts:  [phone]  En caso de inclemencias del [place], por favor llame a nuestra lnea principal al [phone] para una actualizacin sobre el estado de cualquier retraso o cierre.  Consejos para la medicacin en dermatologa: Por favor, guarde las cajas en las que vienen los medicamentos de uso tpico para ayudarle a seguir las instrucciones sobre dnde y cmo usarlos. Las farmacias generalmente imprimen las instrucciones del medicamento slo en las cajas y no directamente en los tubos del [place].   Si su medicamento es muy caro, por favor, pngase en contacto con landry rieger llamando al [phone] y presione la opcin 4 o envenos un mensaje a travs de Clinical cytogeneticist.   No podemos decirle cul ser su copago por los medicamentos por adelantado ya que esto es diferente dependiendo de la cobertura de su seguro. Sin embargo, es  posible que podamos encontrar un medicamento sustituto a Audiological scientist un formulario para que el seguro cubra el medicamento que se considera necesario.   Si se requiere una autorizacin previa para que su compaa de seguros malta su medicamento, por favor permtanos de 1 a 2 das hbiles para completar este proceso.  Los precios de los medicamentos varan con frecuencia dependiendo del Environmental consultant de dnde se surte la receta y alguna farmacias pueden ofrecer precios ms baratos.  El sitio web www.goodrx.com tiene cupones para medicamentos de Health and safety inspector. Los precios aqu no tienen en cuenta lo que podra costar con la ayuda del seguro (puede ser ms barato con su seguro), pero el sitio web puede darle el precio si no utiliz Tourist information centre manager.  - Puede imprimir el cupn correspondiente y llevarlo con su receta a la farmacia.  - Tambin puede pasar por nuestra oficina durante el horario de atencin regular y Education officer, museum una tarjeta de cupones de GoodRx.  - Si necesita que su receta se enve electrnicamente a una farmacia diferente, informe a nuestra oficina a travs de  MyChart de [place] o por telfono llamando al [phone] y presione la opcin 4.

## 2024-06-30 NOTE — Progress Notes (Unsigned)
 Follow-Up Visit   Subjective  Abigail Gross is a 63 y.o. female who presents for the following: Botox for facial elastosis  Patient also reports itchy skin at arms that has kept her up at night for a few months. She is also bothered by skin around toenails and would like to discuss treatment  The following portions of the chart were reviewed this encounter and updated as appropriate: medications, allergies, medical history  Review of Systems:  No other skin or systemic complaints except as noted in HPI or Assessment and Plan.  Objective  Well appearing patient in no apparent distress; mood and affect are within normal limits.  A focused examination was performed of the face, arms, feet   Relevant physical exam findings are noted in the Assessment and Plan.  Rash at b/l arms   Rash at b/l arms   Rash at b/l arms   Rash at b/l arms   Rash at b/l arms    Photos of  hammertoes and callus           Injection map photo      Assessment & Plan   PRURITUS   RASH   CALLUS OF TOE   ELASTOSIS OF SKIN   Facial Elastosis 4 units total  Upper lip flip lateral   Location: See attached image  Informed consent: Discussed risks (infection, pain, bleeding, bruising, swelling, allergic reaction, paralysis of nearby muscles, eyelid droop, double vision, neck weakness, difficulty breathing, headache, undesirable cosmetic result, and need for additional treatment) and benefits of the procedure, as well as the alternatives.  Informed consent was obtained.  Preparation: The area was cleansed with alcohol.  Procedure Details:  Botox was injected into the dermis with a 30-gauge needle. Pressure applied to any bleeding. Ice packs offered for swelling.  Lot Number:  [id number] Expiration:  10/27  Total Units Injected:  4  Plan: Tylenol may be used for headache.  Allow 2 weeks before returning to clinic for additional dosing as needed. Patient will call for any  problems.   Rash with Itch Pruritus with erythema at arms likely histamine release that may be secondary to recent stress  Exam: Pinkness of arms See photos  Treatment Plan: Recommend  For Itch: Start non sedating antihistamine (either Allegra 180mg , or Claritin 10mg , or Zyrtec 10mg ) daily.  All these are non-prescription (Over the Counter).   Start out with 1 pill a day.   After a week if not improving may increase to 2 pills a day.   After another week if not improving may increase to 3 pills a day.   After another week if still not improving may take up to 4 pills a day. Stay at highest dose that keeps condition controlled, but only up to 4 pills a day. Stay at the controlling dose for at least 2 weeks. Contact office if taking 4 pills of antihistamine a day for at least 2 weeks without control of condition as other options may be available.  If this doesn't work after taking a 4 pills daily after 1 week will consider Dupixent injection Start Mometasone cream bid arms for rash.    SEBORRHEIC KERATOSIS - Stuck-on, waxy, tan-brown papules and/or plaques  - Benign-appearing - Discussed benign etiology and prognosis. - Observe - Call for any changes   Hammertoes with resulting callus at toes  with callus at toes tips and around toenails  Photos  Use things urea cream otc 40 % use daily to help with  roughness use at least 6 weeks  Recommend using daily for at least 6 weeks    Return for 1 month botox lip flip, .  I, Eleanor Blush, CMA, am acting as scribe for Alm Rhyme, MD.   Documentation: I have reviewed the above documentation for accuracy and completeness, and I agree with the above.  Alm Rhyme, MD

## 2024-07-02 ENCOUNTER — Encounter: Payer: Self-pay | Admitting: Dermatology

## 2024-07-02 MED ORDER — MOMETASONE FUROATE 0.1 % EX CREA: TOPICAL_CREAM | CUTANEOUS | 1 refills | Status: AC

## 2024-07-02 NOTE — Addendum Note (Signed)
 Addended by: TANDA SETTER A on: 07/02/2024 03:26 PM   Modules accepted: Orders

## 2024-07-06 ENCOUNTER — Telehealth: Payer: Self-pay

## 2024-07-06 NOTE — Patient Instructions (Incomplete)
 Fibroids in the Uterus: What to Know  Fibroids are growths (tumors) in the uterus. Fibroids are not cancer. Most people with this condition do not need treatment. Sometimes, fibroids can make it hard to get pregnant. If this happens, you may need surgery to take out the fibroids. What are the causes? The cause of this condition is not known. What increases the risk? You're in your 30s or 40s and have not stopped having a period. You have family members who have had fibroids. You're of African American descent. You started your period at age 63 or younger. You've not given birth. You're overweight or you're obese. You eat a diet low in fruits, vegetables, and vitamin D . What are the signs or symptoms? Heavy bleeding during your period. Bleeding between periods. Pain in the area between your hips (pelvis). Pain during sex. Needing to pee right away or peeing more often than usual. Not being able to have children. Not being able to stay pregnant (miscarriage). Many people do not have symptoms. How is this treated? Treatment may include: Medicines to help with pain, such as aspirin or ibuprofen. Hormone treatment. This may be given as a pill, in a shot, or with a type of birth control device called an IUD. Surgery. This may be done to: Take out the fibroids. This may be done if you want to become pregnant. Take out the uterus. Stop the blood flow to the fibroids. Procedures to shrink the fibroids. This may be done with: Heat and radio energy. Ultrasound waves. Follow these instructions at home: Medicines Take your medicines only as told. You can lose a lot of iron because of heavy bleeding from your period. Ask your doctor if you should: Take iron pills. Eat more foods that have iron in them, such as dark green, leafy vegetables. Managing pain  Put heat on your back or belly as told. Use the heat source that your doctor recommends, such as a moist heat pack or a heating pad. Do  this as often as told. Put a towel between your skin and the heat source. Leave the heat on for 20-30 minutes. If your skin turns red, take off the heat right away to prevent burns. The risk of burns is higher if you can't feel pain, heat, or cold. General instructions Tell your doctor about any changes in your period, such as: Heavy bleeding that needs a change of tampons or pads more than normal. A change in how many days your period lasts. A change in symptoms that come with your period. This might be cramps in your belly or pain in your back. Keep all follow-up visits. Your doctor needs to check your fibroids for any changes. Contact a doctor if: You have pain that does not get better with medicine or heat. This may include pain or cramps in: The area between your hip bones. Your back. Your belly. You have new bleeding between your periods. You have more bleeding during or between your periods. You feel very tired or weak. You feel dizzy. Get help right away if: You faint. You have pain in the area between your hip bones that gets worse. You have bleeding that soaks a tampon or pad in 30 minutes or less. This information is not intended to replace advice given to you by your health care provider. Make sure you discuss any questions you have with your health care provider. Document Revised: 03/12/2023 Document Reviewed: 03/12/2023 Elsevier Patient Education  2024 [employer].

## 2024-07-06 NOTE — Progress Notes (Deleted)
    GYNECOLOGY PROGRESS NOTE  Subjective:  PCP: Diedra Lame, MD  Patient ID: Abigail Gross, female    DOB: October 26, 1960, 63 y.o.   MRN: [id number]  HPI  Patient is a 63 y.o. [id number] female who presents for follow-up of pelvic pain.  TVUS done 06/17/24.    {Common ambulatory SmartLinks:19316}  Review of Systems {ros; complete:30496}   Objective:   Last menstrual period 08/24/2016. There is no height or weight on file to calculate BMI.  General appearance: {general exam:16600} Abdomen: {abdominal exam:16834} Pelvic: {pelvic exam:16852::cervix normal in appearance,external genitalia normal,no adnexal masses or tenderness,no cervical motion tenderness,rectovaginal septum normal,uterus normal size, shape, and consistency,vagina normal without discharge} Extremities: {extremity exam:5109} Neurologic: {neuro exam:17854}  Ultrasound 06/17/24 Indications:Pelvic Pain and H/O fibroids. Findings:  The uterus is anteverted and measures 7.3 x 3.8 x 4.8 cm with a uterine volume of 68.66 ml. Echo texture is heterogenous with evidence of focal masses. Within the uterus are multiple suspected fibroids. The largest three measuring:   Fibroid 1: .9 x .7 x 1.0 cm (Intramural) Fibroid 2: 2.6 x 1.8 x 2.9 cm (Intramural) Fibroid 3:  1.3 x 1.0 x 1.2 cm (Intramural)   There are a couple of calcified areas noted in the fundus of the uterus that most likely represent Calcified fibroids.   The Endometrium measures 4.0 mm.   Right Ovary was not identified vaginally. Left Ovary measures 1.9 x 1.6 x 1.3 cm. It is normal in appearance. Survey of the adnexa demonstrates no adnexal masses. There is no free fluid in the cul de sac.   Impression: 1. Fibroid uterus seen. 2.Unable to visualize Rt Ovary Assessment/Plan:   No diagnosis found.   There are no diagnoses linked to this encounter.     Estil Mangle, DO [facility]

## 2024-07-06 NOTE — Telephone Encounter (Signed)
 Called patient regarding rash at arms and hands to see how she was doing. Patient reports she is still very itchy and antihistamines are not helping. She has been taking up to 4 tabs daily.   Discussed mometasone cream with patient and directions. Had tried to call patient last Thursday Oct 9 to see how she was doing and Dr. Hester had prescribed mometasone cream to use at arms and hands twice daily as needed. Was unable to get a hold of patient so sent mychart message. Rx was sent to [facility].  Patient stated she did not get mychart message but would pick up prescription cream to use at rash areas. Offered patient an appointment for follow up on rash. Patient would like to try mometasone cream to affected areas first and call us  back.

## 2024-07-11 ENCOUNTER — Ambulatory Visit: Payer: Self-pay | Admitting: Advanced Practice Midwife

## 2024-07-14 ENCOUNTER — Ambulatory Visit: Admitting: Obstetrics

## 2024-07-23 ENCOUNTER — Encounter: Payer: Self-pay | Admitting: Emergency Medicine

## 2024-07-23 ENCOUNTER — Ambulatory Visit
Admission: EM | Admit: 2024-07-23 | Discharge: 2024-07-23 | Disposition: A | Discharge status: Home / Self Care | Attending: Emergency Medicine | Admitting: Emergency Medicine

## 2024-07-23 DIAGNOSIS — J209 Acute bronchitis, unspecified: Secondary | ICD-10-CM

## 2024-07-23 MED ORDER — GUAIFENESIN-CODEINE 100-10 MG/5ML PO SOLN: 5.0000 mL | Freq: Every evening | ORAL | 0 refills | Status: AC | PRN

## 2024-07-23 MED ORDER — PREDNISONE 10 MG (21) PO TBPK: ORAL_TABLET | Freq: Every day | ORAL | 0 refills | Status: DC

## 2024-07-23 MED ORDER — BENZONATATE 100 MG PO CAPS: 100.0000 mg | ORAL_CAPSULE | Freq: Three times a day (TID) | ORAL | 0 refills | Status: AC

## 2024-07-23 MED ORDER — AMOXICILLIN-POT CLAVULANATE 875-125 MG PO TABS: 1.0000 | ORAL_TABLET | Freq: Two times a day (BID) | ORAL | 0 refills | Status: DC

## 2024-07-23 NOTE — ED Provider Notes (Signed)
 Abigail Gross    CSN: [id number] Arrival date & time: 07/23/24  1135      History   Chief Complaint Chief Complaint  Patient presents with   Cough    HPI Abigail Gross is a 63 y.o. female.   Patient presents for evaluation of nasal congestion, productive cough with thick discolored mucus bilateral ear fullness, sinus pressure, intermittent to the teethly, sore throat, shortness of breath at rest and intermittent wheezing with cough present for 2 weeks.  No known sick contacts prior.  Tolerable to food and liquids.  Has attempted use of albuterol  inhaler, nasal spray, has gone and completed infusions at the local [facility].  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 Authorizing Provider  amoxicillin-clavulanate (AUGMENTIN) 875-125 MG tablet Take 1 tablet by mouth every 12 (twelve) hours. 07/23/24  Yes Oisin Yoakum R, NP  benzonatate (TESSALON) 100 MG capsule Take 1 capsule (100 mg total) by mouth every 8 (eight) hours. 07/23/24  Yes Isauro Skelley R, NP  guaiFENesin-codeine 100-10 MG/5ML syrup Take 5 mLs by mouth  at bedtime as needed for cough. 07/23/24  Yes Morris Markham R, NP  predniSONE  (STERAPRED UNI-PAK 21 TAB) 10 MG (21) TBPK tablet Take by mouth daily. Take 6 tabs by mouth daily  for 1 days, then 5 tabs for 1 days, then 4 tabs for 1 days, then 3 tabs for 1 days, 2 tabs for 1 days, then 1 tab by mouth daily for 1 days 07/23/24  Yes Jayna Mulnix R, NP  albuterol  (PROVENTIL  HFA;VENTOLIN  HFA) 108 (90 Base) MCG/ACT inhaler Inhale 2 puffs into the lungs every 6 (six) hours as needed for wheezing or shortness of breath. 10/10/17   Defrancesco, Gladis LABOR, MD  cetirizine (ZYRTEC) 10 MG tablet Take 10 mg by mouth as needed.     [provider]  DULoxetine (CYMBALTA) 60 MG capsule Take 120 mg by mouth daily.    [provider]  EUCRISA 2 % OINT Apply     as directed QD/BID to AA's for eczema Patient not taking: Reported on 06/30/2024 10/28/19   [provider]  fluticasone (FLONASE) 50 MCG/ACT nasal spray SPRAY TWO SPRAYS IN EACH NOSTRIL ONCE DAILY    [provider]  mometasone (ELOCON) 0.1 % cream Apply bid to rash at arms and hands prn 07/02/24   Hester Alm BROCKS, MD  montelukast (SINGULAIR) 10 MG tablet Take 1 tablet by mouth daily. Patient not taking: Reported on 06/30/2024    [provider]  potassium gluconate 595 (99 K) MG TABS tablet Take by mouth.    [provider]  triamterene-hydrochlorothiazide (MAXZIDE-25) 37.5-25 MG per tablet Take 1 tablet by mouth daily.     [provider]  valACYclovir (VALTREX) 1000 MG tablet 1,000 mg as needed.  07/07/14   [provider]  valACYclovir (VALTREX) 1000 MG tablet Take 1 tablet by mouth 2 (two) times daily. 12/26/21   [provider]  zolpidem (AMBIEN) 10 MG tablet Take 10 mg by mouth at bedtime as needed for sleep.    [provider]  Zoster Vaccine Adjuvanted [facility]) injection     [provider]    Family History Family History  Problem Relation Age of  Onset   Hypertension Mother    Hypertension Father    Breast cancer Maternal Aunt 39   Colon cancer Paternal Grandmother    Diabetes Paternal Grandfather    Heart disease Neg Hx    Ovarian cancer Neg Hx     Social History Social History   Tobacco Use   Smoking status: Never   Smokeless tobacco: Never  Vaping Use   Vaping status: Never Used  Substance Use Topics   Alcohol use: Yes    Alcohol/week: 1.0 standard drink of alcohol    Types: 1 Glasses of wine per week    Comment: 1 drink a day and more on weekedns   Drug use: No     Allergies   Sulfa antibiotics   Review of Systems Review of Systems   Physical Exam Triage Vital Signs ED Triage Vitals  Encounter Vitals Group     BP 07/23/24 1300 (!) 142/89     Girls Systolic BP Percentile --      Girls Diastolic BP Percentile --      Boys Systolic BP Percentile --      Boys Diastolic BP Percentile --      Pulse Rate 07/23/24 1300 60     Resp 07/23/24 1300 18     Temp 07/23/24 1300 97.9 F (36.6 C)     Temp Source 07/23/24 1300 Oral     SpO2 07/23/24 1300 96 %     Weight --      Height --      Head Circumference --      Peak Flow --      Pain Score 07/23/24 1257 0     Pain Loc --      Pain Education --      Exclude from Growth Chart --    No data found.  Updated Vital Signs BP (!) 142/89 (BP Location: Right Arm)   Pulse 60   Temp 97.9 F (36.6 C) (Oral)   Resp 18   LMP 08/24/2016 (Exact Date)   SpO2 96%   Visual Acuity Right Eye Distance:   Left Eye Distance:   Bilateral Distance:    Right Eye Near:   Left Eye Near:    Bilateral Near:     Physical Exam Constitutional:      Appearance: Normal appearance.  HENT:     Right Ear: Tympanic membrane, ear canal and external ear normal.     Left Ear: Tympanic membrane, ear canal and external ear normal.  Nose: Congestion present.     Mouth/Throat:     Mouth: Mucous membranes are moist.     Pharynx: Oropharynx is clear.  Eyes:     Extraocular  Movements: Extraocular movements intact.  Cardiovascular:     Rate and Rhythm: Normal rate and regular rhythm.     Pulses: Normal pulses.     Heart sounds: Normal heart sounds.  Pulmonary:     Effort: Pulmonary effort is normal.     Breath sounds: Wheezing present.  Neurological:     Mental Status: She is alert and oriented to person, place, and time. Mental status is at baseline.      UC Treatments / Results  Labs (all labs ordered are listed, but only abnormal results are displayed) Labs Reviewed - No data to display  EKG   Radiology No results found.  Procedures Procedures (including critical care time)  Medications Ordered in UC Medications - No data to display  Initial Impression / Assessment and Plan / UC Course  I have reviewed the triage vital signs and the nursing notes.  Pertinent labs & imaging results that were available during my care of the patient were reviewed by me and considered in my medical decision making (see chart for details).  Acute bronchitis  Vital signs stable, O2 saturation 96% on room air, significant wheezing present on exam, patient in no signs or distress nontoxic-appearing, stable for outpatient management, symptoms present for 2 weeks, prescribed Augmentin for treatment as well as prednisone  Tessalon guaifenesin codeine, PDMP reviewed, low risk recommend over-the-counter medications and nonpharmacological supportive care with follow-up as Final Clinical Impressions(s) / UC Diagnoses   Final diagnoses:  Acute bronchitis, unspecified organism     Discharge Instructions      Begin Augmentin twice daily for 7 days  Begin prednisone  and morning with food to open and relax the airway to help with shortness of breath and wheezing, avoid ibuprofen while taking  May use Tessalon pill every 8 hours as needed for cough, may use cough syrup at bedtime to allow for rest  You can take Tylenol  as needed for fever reduction and pain relief.    For cough: honey 1/2 to 1 teaspoon (you can dilute the honey in water or another fluid).  You can also use guaifenesin and dextromethorphan for cough. You can use a humidifier for chest congestion and cough.  If you don't have a humidifier, you can sit in the bathroom with the hot shower running.      For sore throat: try warm salt water gargles, cepacol lozenges, throat spray, warm tea or water with lemon/honey, popsicles or ice, or OTC cold relief medicine for throat discomfort.   For congestion: take a daily anti-histamine like Zyrtec, Claritin, and a oral decongestant, such as pseudoephedrine.  You can also use Flonase 1-2 sprays in each nostril daily.   It is important to stay hydrated: drink plenty of fluids (water, gatorade/powerade/pedialyte, juices, or teas) to keep your throat moisturized and help further relieve irritation/discomfort.    ED Prescriptions     Medication Sig Dispense Auth. Provider   amoxicillin-clavulanate (AUGMENTIN) 875-125 MG tablet Take 1 tablet by mouth every 12 (twelve) hours. 14 tablet Twanda Stakes R, NP   predniSONE  (STERAPRED UNI-PAK 21 TAB) 10 MG (21) TBPK tablet Take by mouth daily. Take 6 tabs by mouth daily  for 1 days, then 5 tabs for 1 days, then 4 tabs for 1 days, then 3 tabs for 1 days, 2 tabs  for 1 days, then 1 tab by mouth daily for 1 days 21 tablet Tyrus Wilms R, NP   benzonatate (TESSALON) 100 MG capsule Take 1 capsule (100 mg total) by mouth every 8 (eight) hours. 21 capsule Nissim Fleischer R, NP   guaiFENesin-codeine 100-10 MG/5ML syrup Take 5 mLs by mouth at bedtime as needed for cough. 120 mL Theophil Thivierge, Shelba SAUNDERS, NP      I have reviewed the PDMP during this encounter.   Teresa Shelba SAUNDERS, NP 07/23/24 [phone]

## 2024-07-23 NOTE — ED Triage Notes (Signed)
 Patient reports cough with green sputum x 2 weeks. Patient also reports intermittent ear pain. Patient denies ear pain. Patient has been using natural remedies with no relief.

## 2024-07-23 NOTE — Discharge Instructions (Signed)
 Begin Augmentin twice daily for 7 days  Begin prednisone  and morning with food to open and relax the airway to help with shortness of breath and wheezing, avoid ibuprofen while taking  May use Tessalon pill every 8 hours as needed for cough, may use cough syrup at bedtime to allow for rest  You can take Tylenol  as needed for fever reduction and pain relief.   For cough: honey 1/2 to 1 teaspoon (you can dilute the honey in water or another fluid).  You can also use guaifenesin and dextromethorphan for cough. You can use a humidifier for chest congestion and cough.  If you don't have a humidifier, you can sit in the bathroom with the hot shower running.      For sore throat: try warm salt water gargles, cepacol lozenges, throat spray, warm tea or water with lemon/honey, popsicles or ice, or OTC cold relief medicine for throat discomfort.   For congestion: take a daily anti-histamine like Zyrtec, Claritin, and a oral decongestant, such as pseudoephedrine.  You can also use Flonase 1-2 sprays in each nostril daily.   It is important to stay hydrated: drink plenty of fluids (water, gatorade/powerade/pedialyte, juices, or teas) to keep your throat moisturized and help further relieve irritation/discomfort.

## 2024-07-29 ENCOUNTER — Ambulatory Visit (INDEPENDENT_AMBULATORY_CARE_PROVIDER_SITE_OTHER): Payer: Self-pay | Admitting: Dermatology

## 2024-07-29 ENCOUNTER — Encounter: Payer: Self-pay | Admitting: Dermatology

## 2024-07-29 DIAGNOSIS — L988 Other specified disorders of the skin and subcutaneous tissue: Secondary | ICD-10-CM

## 2024-07-29 NOTE — Progress Notes (Signed)
   Follow-Up Visit   Subjective  Abigail Gross is a 63 y.o. female who presents for the following: Botox for facial elastosis C/o increased dimpling of chin  The following portions of the chart were reviewed this encounter and updated as appropriate: medications, allergies, medical history  Review of Systems:  No other skin or systemic complaints except as noted in HPI or Assessment and Plan.  Objective  Well appearing patient in no apparent distress; mood and affect are within normal limits.  A focused examination was performed of the face.  Relevant physical exam findings are noted in the Assessment and Plan.  Injection map photo     Assessment & Plan    Facial Elastosis  Botox 74 units injected today to: - Frown complex 20 units - Forehead 6 units - Botox comma 1 unit x 2 - Brow lift 2.5 units x 2 - Crow's feet 10 units x 2 - Bunny lines 1 units x 2 - Upper lip 4 units - L lower lip 2 units - DAO's 4 units x 2 - Chin 5 units   Location: frown complex, forehad, bil botox comma, bil brow lift, bil crow's feet, bil bunny lines, upper lip, L lower lip, bil DAO's, chin  Informed consent: Discussed risks (infection, pain, bleeding, bruising, swelling, allergic reaction, paralysis of nearby muscles, eyelid droop, double vision, neck weakness, difficulty breathing, headache, undesirable cosmetic result, and need for additional treatment) and benefits of the procedure, as well as the alternatives.  Informed consent was obtained.  Preparation: The area was cleansed with alcohol.  Procedure Details:  Botox was injected into the dermis with a 30-gauge needle. Pressure applied to any bleeding. Ice packs offered for swelling.  Lot Number:  [id number] Expiration:  06/2026  Total Units Injected:  74  Plan: Tylenol may be used for headache.  Allow 2 weeks before returning to clinic for additional dosing as needed. Patient will call for any problems.  Return in 1 month (on  08/28/2024) for botox lip flip, 73m full face botox.  I, Grayce Saunas, RMA, am acting as scribe for Rexene Rattler, MD .   Documentation: I have reviewed the above documentation for accuracy and completeness, and I agree with the above.  Rexene Rattler, MD

## 2024-07-29 NOTE — Patient Instructions (Signed)

## 2024-07-31 NOTE — Telephone Encounter (Signed)
 Pt completed her annual exam with Dr. Leigh on 06/23/2024.

## 2024-08-03 NOTE — Progress Notes (Signed)
 HPI:  ANNISE BORAN is a 63 y.o.  [id number] who presents today for evaluation and management of abnormal cervical cytology.  Of note, BP elevated today in 160s/80s on repeat. PMH of HTN and did take her BP medications today. Denies HA, vision changes, palpitations, dizziness/lightheadedness, or weakness.  Prior pap smears:  06/23/2024:  ASCUS, HPV + (16/18/45 neg) 09/28/2015:  NILM, HPV neg   Prior cervical / vaginal findings: NA  Prior cervical treatment(s): NA  Symptoms/History:  -Abnormal vaginal discharge: none -Postmenopausal: Yes -Intermenstrual bleeding: None -Postcoital bleeding: None -Bleeding problems (non-gyn): None -Contraception: postmenopausal status -Number of current sexual partners: 0 -Number of partners in lifetime: 7 -History of a high risk partner: None -History of STDs: HPV -Smoking: No -Gardasil Vaccine: No      ROS:  Pertinent items are noted in HPI.  OB History  Gravida Para Term Preterm AB Living  2 2 2   2   SAB IAB Ectopic Multiple Live Births      2    # Outcome Date GA Lbr Len/2nd Weight Sex Type Anes PTL Lv  2 Term 1990   8 lb 6.4 oz (3.81 kg) M Vag-Spont   LIV  1 Term 1988   6 lb 1.6 oz (2.767 kg) F Vag-Spont   LIV    Past Medical History:  Diagnosis Date   Anxiety    Edema    Enlarged uterus    Fibroid    GERD (gastroesophageal reflux disease)    Hypertension    Insomnia    Ovarian cyst    Shingles     Past Surgical History:  Procedure Laterality Date   TUBAL LIGATION      SOCIAL HISTORY:  Social History   Substance and Sexual Activity  Alcohol Use Yes   Alcohol/week: 1.0 standard drink of alcohol   Types: 1 Glasses of wine per week   Comment: 1 drink a day and more on weekedns    Social History   Substance and Sexual Activity  Drug Use No     Family History  Problem Relation Age of Onset   Hypertension Mother    Hypertension Father    Breast cancer Maternal Aunt 49   Colon cancer Paternal  Grandmother    Diabetes Paternal Grandfather    Heart disease Neg Hx    Ovarian cancer Neg Hx     ALLERGIES:  Sulfa antibiotics  She has a current medication list which includes the following prescription(s): albuterol , duloxetine, fluticasone, mupirocin ointment, triamterene-hydrochlorothiazide, valacyclovir, zolpidem, amoxicillin-clavulanate, benzonatate, cetirizine, eucrisa, guaifenesin-codeine, mometasone, montelukast, potassium gluconate, prednisone , valacyclovir, and shingrix.  Physical Exam: -Vitals:  BP (!) 166/84   Pulse 68   Wt 133 lb (60.3 kg)   LMP 08/24/2016 (Exact Date)   BMI 23.56 kg/m    Physical Exam Vitals and nursing note reviewed. Exam conducted with a chaperone present.  Constitutional:      Appearance: Normal appearance.  HENT:     Head: Normocephalic and atraumatic.  Eyes:     Extraocular Movements: Extraocular movements intact.  Pulmonary:     Effort: Pulmonary effort is normal.  Genitourinary:    General: Normal vulva.     Vagina: Normal.     Cervix: Friability present. No cervical motion tenderness, lesion or erythema.     Comments: Vaginal mucosa moderately atrophic Cervix stenosed, required dilation Neurological:     General: No focal deficit present.     Mental Status: She is alert.  PROCEDURE: Colposcopy performed with 4% acetic acid and Lugol's after informed consent obtained. Cervix stenosed; cleansed with betadine and tenaculum placed. Gentle pressure with dilators and os opened.                            -Aceto-white Lesions Location(s): None              -Biopsy performed: None               -ECC indicated and performed: Yes.       -Biopsy sites made hemostatic with pressure and Monsel's solution   -Satisfactory colposcopy: Yes.      -Evidence of Invasive cervical CA :  NO  ASSESSMENT:  WENDELIN READER is a 63 y.o. 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  -ECC and 0 cervical bx sent to pathology -Aftercare instructions for home reviewed, si/sx of when to call/return discussed. -Discussed possible outcomes based on pathology; will call with results   Estil Mangle, DO [facility] [place]

## 2024-08-04 ENCOUNTER — Ambulatory Visit: Payer: Self-pay | Admitting: Dermatology

## 2024-08-06 ENCOUNTER — Ambulatory Visit: Payer: BC Managed Care – PPO | Admitting: Dermatology

## 2024-08-06 ENCOUNTER — Encounter: Payer: Self-pay | Admitting: Dermatology

## 2024-08-06 DIAGNOSIS — L089 Local infection of the skin and subcutaneous tissue, unspecified: Secondary | ICD-10-CM

## 2024-08-06 DIAGNOSIS — B958 Unspecified staphylococcus as the cause of diseases classified elsewhere: Secondary | ICD-10-CM

## 2024-08-06 DIAGNOSIS — W908XXA Exposure to other nonionizing radiation, initial encounter: Secondary | ICD-10-CM

## 2024-08-06 DIAGNOSIS — L814 Other melanin hyperpigmentation: Secondary | ICD-10-CM | POA: Diagnosis not present

## 2024-08-06 DIAGNOSIS — D229 Melanocytic nevi, unspecified: Secondary | ICD-10-CM

## 2024-08-06 DIAGNOSIS — L720 Epidermal cyst: Secondary | ICD-10-CM

## 2024-08-06 DIAGNOSIS — L820 Inflamed seborrheic keratosis: Secondary | ICD-10-CM

## 2024-08-06 DIAGNOSIS — L578 Other skin changes due to chronic exposure to nonionizing radiation: Secondary | ICD-10-CM | POA: Diagnosis not present

## 2024-08-06 DIAGNOSIS — L821 Other seborrheic keratosis: Secondary | ICD-10-CM | POA: Diagnosis not present

## 2024-08-06 DIAGNOSIS — Z1283 Encounter for screening for malignant neoplasm of skin: Secondary | ICD-10-CM | POA: Diagnosis not present

## 2024-08-06 DIAGNOSIS — Z7189 Other specified counseling: Secondary | ICD-10-CM

## 2024-08-06 DIAGNOSIS — D1801 Hemangioma of skin and subcutaneous tissue: Secondary | ICD-10-CM

## 2024-08-06 DIAGNOSIS — B351 Tinea unguium: Secondary | ICD-10-CM

## 2024-08-06 MED ORDER — MUPIROCIN 2 % EX OINT: TOPICAL_OINTMENT | CUTANEOUS | 2 refills | Status: AC

## 2024-08-06 NOTE — Progress Notes (Signed)
 Follow-Up Visit   Subjective  Abigail Gross is a 63 y.o. female who presents for the following: Skin Cancer Screening and Full Body Skin Exam. No personal Hx of skin cancer. C/O hard white spots that look like pimples.   The patient presents for Total-Body Skin Exam (TBSE) for skin cancer screening and mole check. The patient has spots, moles and lesions to be evaluated, some may be new or changing and the patient may have concern these could be cancer.    The following portions of the chart were reviewed this encounter and updated as appropriate: medications, allergies, medical history  Review of Systems:  No other skin or systemic complaints except as noted in HPI or Assessment and Plan.  Exam of nails limited by presence of nail polish.   Objective  Well appearing patient in no apparent distress; mood and affect are within normal limits.  A full examination was performed including scalp, head, eyes, ears, nose, lips, neck, chest, axillae, abdomen, back, buttocks, bilateral upper extremities, bilateral lower extremities, hands, feet, fingers, toes, fingernails, and toenails. All findings within normal limits unless otherwise noted below.   Relevant physical exam findings are noted in the Assessment and Plan.  Right Upper Arm - Anterior x1 Erythematous keratotic or waxy stuck-on papule or plaque. Nose Scaly papules alar rims  Assessment & Plan   SKIN CANCER SCREENING PERFORMED TODAY.  ACTINIC DAMAGE - Chronic condition, secondary to cumulative UV/sun exposure - diffuse scaly erythematous macules with underlying dyspigmentation - Recommend daily broad spectrum sunscreen SPF 30+ to sun-exposed areas, reapply every 2 hours as needed.  - Staying in the shade or wearing long sleeves, sun glasses (UVA+UVB protection) and wide brim hats (4-inch brim around the entire circumference of the hat) are also recommended for sun protection.  - Call for new or changing  lesions.  LENTIGINES, SEBORRHEIC KERATOSES, HEMANGIOMAS - Benign normal skin lesions - Benign-appearing - Call for any changes  MELANOCYTIC NEVI - Tan-brown and/or pink-flesh-colored symmetric macules and papules - Benign appearing on exam today - Observation - Call clinic for new or changing moles - Recommend daily use of broad spectrum spf 30+ sunscreen to sun-exposed areas.  - Check nails when remove polish.   Milia - tiny firm white papules - type of cyst - benign - sometimes these will clear with nightly OTC adapalene/Differin 0.1% gel or retinol. - may be extracted if symptomatic - observe  ONYCHOMYCOSIS Exam: Thickened toenails with subungal debris c/w onychomycosis  Chronic and persistent condition with duration or expected duration over one year. Condition is symptomatic/ bothersome to patient. Not currently at goal.  Treatment Plan: Discussed oral treatment. Patient deferred treatment at this time.     Call if would like to start oral Terbinafine.    Terbinafine Counseling   Terbinafine is an anti-fungal medicine that can be applied to the skin (over the counter) or taken by mouth (prescription) to treat fungal infections. The pill version is often used to treat fungal infections of the nails or scalp. While most people do not have any side effects from taking terbinafine pills, some possible side effects of the medicine can include taste changes, headache, loss of smell, vision changes, nausea, vomiting, or diarrhea.    Rare side effects can include irritation of the liver, allergic reaction, or decrease in blood counts (which may show up as not feeling well or developing an infection). If you are concerned about any of these side effects, please stop the medicine and call  your doctor, or in the case of an emergency such as feeling very unwell, seek immediate medical care.       INFLAMED SEBORRHEIC KERATOSIS Right Upper Arm - Anterior x1 Symptomatic, irritating,  patient would like treated. Destruction of lesion - Right Upper Arm - Anterior x1 Complexity: simple   Destruction method: cryotherapy   Informed consent: discussed and consent obtained   Timeout:  patient name, date of birth, surgical site, and procedure verified Lesion destroyed using liquid nitrogen: Yes   Region frozen until ice ball extended beyond lesion: Yes   Cryo cycles: 1 or 2. Outcome: patient tolerated procedure well with no complications   Post-procedure details: wound care instructions given   Additional details:  Prior to procedure, discussed risks of blister formation, small wound, skin dyspigmentation, or rare scar following cryotherapy. Recommend Vaseline ointment to treated areas while healing.   STAPH SKIN INFECTION Nose Start Mupirocin ointment TID inside nostrils until resolved.  MULTIPLE BENIGN NEVI   LENTIGINES   ACTINIC ELASTOSIS   SEBORRHEIC KERATOSES   CHERRY ANGIOMA   MILIA   ONYCHOMYCOSIS   Return in about 1 year (around 08/06/2025) for TBSE.  I, Kate Fought, CMA, am acting as scribe for Boneta Sharps, MD.   Documentation: I have reviewed the above documentation for accuracy and completeness, and I agree with the above.  Boneta Sharps, MD

## 2024-08-06 NOTE — Patient Instructions (Addendum)
 Cryotherapy Aftercare  Wash gently with soap and water everyday.   Apply Vaseline and Band-Aid daily until healed.    Recommend daily broad spectrum sunscreen SPF 30+ to sun-exposed areas, reapply every 2 hours as needed. Call for new or changing lesions.  Staying in the shade or wearing long sleeves, sun glasses (UVA+UVB protection) and wide brim hats (4-inch brim around the entire circumference of the hat) are also recommended for sun protection.     Melanoma ABCDEs  Melanoma is the most dangerous type of skin cancer, and is the leading cause of death from skin disease.  You are more likely to develop melanoma if you: Have light-colored skin, light-colored eyes, or red or blond hair Spend a lot of time in the sun Tan regularly, either outdoors or in a tanning bed Have had blistering sunburns, especially during childhood Have a close family member who has had a melanoma Have atypical moles or large birthmarks  Early detection of melanoma is key since treatment is typically straightforward and cure rates are extremely high if we catch it early.   The first sign of melanoma is often a change in a mole or a new dark spot.  The ABCDE system is a way of remembering the signs of melanoma.  A for asymmetry:  The two halves do not match. B for border:  The edges of the growth are irregular. C for color:  A mixture of colors are present instead of an even brown color. D for diameter:  Melanomas are usually (but not always) greater than 6mm - the size of a pencil eraser. E for evolution:  The spot keeps changing in size, shape, and color.  Please check your skin once per month between visits. You can use a small mirror in front and a large mirror behind you to keep an eye on the back side or your body.   If you see any new or changing lesions before your next follow-up, please call to schedule a visit.  Please continue daily skin protection including broad spectrum sunscreen SPF 30+ to  sun-exposed areas, reapplying every 2 hours as needed when you're outdoors.   Staying in the shade or wearing long sleeves, sun glasses (UVA+UVB protection) and wide brim hats (4-inch brim around the entire circumference of the hat) are also recommended for sun protection.     Due to recent changes in healthcare laws, you may see results of your pathology and/or laboratory studies on MyChart before the doctors have had a chance to review them. We understand that in some cases there may be results that are confusing or concerning to you. Please understand that not all results are received at the same time and often the doctors may need to interpret multiple results in order to provide you with the best plan of care or course of treatment. Therefore, we ask that you please give us  2 business days to thoroughly review all your results before contacting the office for clarification. Should we see a critical lab result, you will be contacted sooner.   If You Need Anything After Your Visit  If you have any questions or concerns for your doctor, please call our main line at [phone] and press option 4 to reach your doctor's medical assistant. If no one answers, please leave a voicemail as directed and we will return your call as soon as possible. Messages left after 4 pm will be answered the following business day.   You may also send us  a  message via MyChart. We typically respond to MyChart messages within 1-2 business days.  For prescription refills, please ask your pharmacy to contact our office. Our fax number is [phone].  If you have an urgent issue when the clinic is closed that cannot wait until the next business day, you can page your doctor at the number below.    Please note that while we do our best to be available for urgent issues outside of office hours, we are not available 24/7.   If you have an urgent issue and are unable to reach us , you may choose to seek medical care at your  doctor's office, retail clinic, urgent care center, or emergency room.  If you have a medical emergency, please immediately call 911 or go to the emergency department.  Pager Numbers  - Dr. Hester: [phone]  - Dr. Jackquline: [phone]  - Dr. Claudene: [phone]   - Dr. Raymund: [phone]  In the event of inclement weather, please call our main line at [phone] for an update on the status of any delays or closures.  Dermatology Medication Tips: Please keep the boxes that topical medications come in in order to help keep track of the instructions about where and how to use these. Pharmacies typically print the medication instructions only on the boxes and not directly on the medication tubes.   If your medication is too expensive, please contact our office at [phone] option 4 or send us  a message through MyChart.   We are unable to tell what your co-pay for medications will be in advance as this is different depending on your insurance coverage. However, we may be able to find a substitute medication at lower cost or fill out paperwork to get insurance to cover a needed medication.   If a prior authorization is required to get your medication covered by your insurance company, please allow us  1-2 business days to complete this process.  Drug prices often vary depending on where the prescription is filled and some pharmacies may offer cheaper prices.  The website www.goodrx.com contains coupons for medications through different pharmacies. The prices here do not account for what the cost may be with help from insurance (it may be cheaper with your insurance), but the website can give you the price if you did not use any insurance.  - You can print the associated coupon and take it with your prescription to the pharmacy.  - You may also stop by our office during regular business hours and pick up a GoodRx coupon card.  - If you need your prescription sent electronically to  a different pharmacy, notify our office through [facility] or by phone at [phone] option 4.     Si Usted Necesita Algo Despus de Su Visita  Tambin puede enviarnos un mensaje a travs de Clinical cytogeneticist. Por lo general respondemos a los mensajes de MyChart en el transcurso de 1 a 2 das hbiles.  Para renovar recetas, por favor pida a su farmacia que se ponga en contacto con nuestra oficina. Randi lakes de fax es [place] [phone].  Si tiene un asunto urgente cuando la clnica est cerrada y que no puede esperar hasta el siguiente da hbil, puede llamar/localizar a su doctor(a) al nmero que aparece a continuacin.   Por favor, tenga en cuenta que aunque hacemos todo lo posible para estar disponibles para asuntos urgentes fuera del horario de [place], no estamos disponibles las 24 horas del da, los 7 [address]  [address] de la [place].  Si tiene un problema urgente y no puede comunicarse con nosotros, puede optar por buscar atencin mdica  en el consultorio de su doctor(a), en una clnica privada, en un centro de atencin urgente o en una sala de emergencias.  Si tiene Engineer, drilling, por favor llame inmediatamente al 911 o vaya a la sala de emergencias.  Nmeros de bper  - Dr. Hester: [phone]  - Dra. Jackquline: [phone]  - Dr. Claudene: [phone]  - Dra. Kitts: [phone]  En caso de inclemencias del [place], por favor llame a nuestra lnea principal al [phone] para una actualizacin sobre el estado de cualquier retraso o cierre.  Consejos para la medicacin en dermatologa: Por favor, guarde las cajas en las que vienen los medicamentos de uso tpico para ayudarle a seguir las instrucciones sobre dnde y cmo usarlos. Las farmacias generalmente imprimen las instrucciones del medicamento slo en las cajas y no directamente en los tubos del [place].   Si su medicamento es muy caro, por favor, pngase en contacto con landry rieger llamando al [phone] y  presione la opcin 4 o envenos un mensaje a travs de Clinical cytogeneticist.   No podemos decirle cul ser su copago por los medicamentos por adelantado ya que esto es diferente dependiendo de la cobertura de su seguro. Sin embargo, es posible que podamos encontrar un medicamento sustituto a Audiological scientist un formulario para que el seguro cubra el medicamento que se considera necesario.   Si se requiere una autorizacin previa para que su compaa de seguros malta su medicamento, por favor permtanos de 1 a 2 das hbiles para completar este proceso.  Los precios de los medicamentos varan con frecuencia dependiendo del Environmental consultant de dnde se surte la receta y alguna farmacias pueden ofrecer precios ms baratos.  El sitio web www.goodrx.com tiene cupones para medicamentos de Health and safety inspector. Los precios aqu no tienen en cuenta lo que podra costar con la ayuda del seguro (puede ser ms barato con su seguro), pero el sitio web puede darle el precio si no utiliz Tourist information centre manager.  - Puede imprimir el cupn correspondiente y llevarlo con su receta a la farmacia.  - Tambin puede pasar por nuestra oficina durante el horario de atencin regular y Education officer, museum una tarjeta de cupones de GoodRx.  - Si necesita que su receta se enve electrnicamente a una farmacia diferente, informe a nuestra oficina a travs de MyChart de [place] o por telfono llamando al [phone] y presione la opcin 4.

## 2024-08-11 ENCOUNTER — Encounter: Payer: Self-pay | Admitting: Obstetrics

## 2024-08-11 ENCOUNTER — Ambulatory Visit: Admitting: Obstetrics

## 2024-08-11 ENCOUNTER — Other Ambulatory Visit (HOSPITAL_COMMUNITY)
Admission: RE | Admit: 2024-08-11 | Discharge: 2024-08-11 | Disposition: A | Discharge status: Home / Self Care | Source: Ambulatory Visit | Attending: Obstetrics | Admitting: Obstetrics

## 2024-08-11 VITALS — BP 166/84 | HR 68 | Wt 133.0 lb

## 2024-08-11 DIAGNOSIS — R87610 Atypical squamous cells of undetermined significance on cytologic smear of cervix (ASC-US): Secondary | ICD-10-CM | POA: Diagnosis present

## 2024-08-11 DIAGNOSIS — N870 Mild cervical dysplasia: Secondary | ICD-10-CM

## 2024-08-11 DIAGNOSIS — N882 Stricture and stenosis of cervix uteri: Secondary | ICD-10-CM | POA: Insufficient documentation

## 2024-08-11 NOTE — Patient Instructions (Signed)
 Colposcopy, Care After  The following information offers guidance on how to care for yourself after your procedure. Your doctor may also give you more specific instructions. If you have problems or questions, contact your doctor. What can I expect after the procedure? If you did not have a sample of your tissue taken out (did not have a biopsy), you may only have some spotting of blood for a few days. You can go back to your normal activities. If you had a sample of your tissue taken out, it is common to have: Soreness and mild pain. These may last for a few days. Mild bleeding or fluid (discharge) coming from your vagina. The fluid will look dark and grainy. You may have this for a few days. The fluid may be caused by a liquid that was used during your procedure. You may need to wear a sanitary pad. Spotting of blood for at least 48 hours after the procedure. Follow these instructions at home: Medicines Take over-the-counter and prescription medicines only as told by your doctor. Ask your doctor what over-the-counter pain medicines and prescription medicines you can start taking again. This is very important if you take blood thinners. Activity For at least 3 days, or for as long as told by your doctor, avoid: Douching. Using tampons. Having sex. Return to your normal activities as told by your doctor. Ask your doctor what activities are safe for you. General instructions Ask your doctor if you may take baths, swim, or use a hot tub. You may take showers. If you use birth control (contraception), keep using it. Keep all follow-up visits. Contact a doctor if: You have a fever or chills. You faint or feel light-headed. Get help right away if: You bleed a lot from your vagina. A lot of bleeding means that the bleeding soaks through a pad in less than 1 hour. You have clumps of blood (blood clots) coming from your vagina. You have signs that could mean you have an infection. This may be  fluid coming from your vagina that is: Different than normal. Yellow. Bad-smelling. You have very bad pain or cramps in your lower belly that do not get better with medicine. Summary If you did not have a sample of your tissue taken out, you may only have some spotting of blood for a few days. You can go back to your normal activities. If you had a sample of your tissue taken out, it is common to have mild pain for a few days and spotting for 48 hours. Avoid douching, using tampons, and having sex for at least 3 days after the procedure or for as long as told. Get help right away if you have a lot of bleeding, very bad pain, or signs of infection. This information is not intended to replace advice given to you by your health care provider. Make sure you discuss any questions you have with your health care provider. Document Revised: 02/05/2021 Document Reviewed: 02/05/2021 Elsevier Patient Education  2024 [employer].

## 2024-08-13 LAB — SURGICAL PATHOLOGY

## 2024-08-26 ENCOUNTER — Ambulatory Visit: Payer: Self-pay | Admitting: Obstetrics

## 2024-08-31 ENCOUNTER — Ambulatory Visit: Admitting: Dermatology

## 2024-09-01 ENCOUNTER — Ambulatory Visit (INDEPENDENT_AMBULATORY_CARE_PROVIDER_SITE_OTHER): Payer: Self-pay | Admitting: Dermatology

## 2024-09-01 ENCOUNTER — Ambulatory Visit: Admitting: Dermatology

## 2024-09-01 DIAGNOSIS — L988 Other specified disorders of the skin and subcutaneous tissue: Secondary | ICD-10-CM

## 2024-09-01 NOTE — Progress Notes (Signed)
   Follow-Up Visit   Subjective  Abigail Gross is a 63 y.o. 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  Objective  Well appearing patient in no apparent distress; mood and affect are within normal limits.  A focused examination was performed of the face.  Relevant physical exam findings are noted in the Assessment and Plan.  Injection map photo     Assessment & Plan    Facial Elastosis  Location: See attached image  Informed consent: Discussed risks (infection, pain, bleeding, bruising, swelling, allergic reaction, paralysis of nearby muscles, eyelid droop, double vision, neck weakness, difficulty breathing, headache, undesirable cosmetic result, and need for additional treatment) and benefits of the procedure, as well as the alternatives.  Informed consent was obtained.  Preparation: The area was cleansed with alcohol.  Procedure Details:  Botox was injected into the dermis with a 30-gauge needle. Pressure applied to any bleeding. Ice packs offered for swelling.  Lot Number:  [id number] Expiration:  07/2026  Total Units Injected:  6  Plan: Tylenol may be used for headache.  Allow 2 weeks before returning to clinic for additional dosing as needed. Patient will call for any problems.  Return in about 1 month (around 10/02/2024) for Botox lip flip.  IAndrea Kerns, CMA, am acting as scribe for Rexene Rattler, MD .   Documentation: I have reviewed the above documentation for accuracy and completeness, and I agree with the above.  Rexene Rattler, MD

## 2024-09-01 NOTE — Patient Instructions (Signed)

## 2024-09-04 ENCOUNTER — Ambulatory Visit
Admission: EM | Admit: 2024-09-04 | Discharge: 2024-09-04 | Disposition: A | Discharge status: Home / Self Care | Attending: Emergency Medicine | Admitting: Emergency Medicine

## 2024-09-04 DIAGNOSIS — R319 Hematuria, unspecified: Secondary | ICD-10-CM | POA: Diagnosis not present

## 2024-09-04 DIAGNOSIS — N390 Urinary tract infection, site not specified: Secondary | ICD-10-CM

## 2024-09-04 LAB — POCT URINE DIPSTICK
Glucose, UA: NEGATIVE mg/dL
Ketones, POC UA: NEGATIVE mg/dL
Nitrite, UA: POSITIVE — AB
POC PROTEIN,UA: 100 — AB
Spec Grav, UA: 1.02 (ref 1.010–1.025)
Urobilinogen, UA: 1 U/dL
pH, UA: 8.5 — AB (ref 5.0–8.0)

## 2024-09-04 MED ORDER — CEPHALEXIN 500 MG PO CAPS: 500.0000 mg | ORAL_CAPSULE | Freq: Three times a day (TID) | ORAL | 0 refills | Status: AC

## 2024-09-04 MED ORDER — PHENAZOPYRIDINE HCL 200 MG PO TABS
200.0000 mg | ORAL_TABLET | Freq: Once | ORAL | Status: AC
  Administered 2024-09-04: 200 mg via ORAL

## 2024-09-04 NOTE — ED Provider Notes (Signed)
 Abigail Gross    CSN: [id number] Arrival date & time: 09/04/24  1551      History   Chief Complaint Chief Complaint  Patient presents with   Dysuria    HPI Abigail Gross is a 63 y.o. female.  Patient presents with dysuria, urinary frequency, bladder pressure, hematuria, back pain x 2 hours.  She reports blood clots in her urine.  No OTC medication taken.  No fever or chills.    The history is provided by the patient and medical records.    Past Medical History:  Diagnosis Date   Anxiety    Edema    Enlarged uterus    Fibroid    GERD (gastroesophageal reflux disease)    Hypertension    Insomnia    Ovarian cyst    Shingles     Patient Active Problem List   Diagnosis Date Noted   Cervical stenosis (uterine cervix) 08/11/2024   ASCUS with positive high risk HPV cervical [R87.610, R87.810] 08/11/2024   Prediabetes 08/06/2022   Osteoarthritis of right hip 07/18/2022   Bronchospastic airway disease 04/06/2019   GERD (gastroesophageal reflux disease) 04/06/2019   Ganglion cyst of finger of right hand 06/30/2018   ADD (attention deficit disorder) 09/28/2015   Essential hypertension 08/03/2014    Past Surgical History:  Procedure Laterality Date   TUBAL LIGATION      OB History     Gravida  2   Para  2   Term  2   Preterm      AB      Living  2      SAB      IAB      Ectopic      Multiple      Live Births  2            Home Medications    Prior to Admission medications  Medication Sig Start Date End Date Taking? Authorizing Provider  cephALEXin (KEFLEX) 500 MG capsule Take 1 capsule (500 mg total) by mouth 3 (three) times daily for 5 days. 09/04/24 09/09/24 Yes Corlis Burnard DEL, NP  albuterol  (PROVENTIL  HFA;VENTOLIN  HFA) 108 (90 Base) MCG/ACT inhaler Inhale 2 puffs into the lungs every 6 (six) hours as needed for wheezing or shortness of breath. 10/10/17   Defrancesco, Gladis LABOR, MD  benzonatate  (TESSALON ) 100 MG capsule Take 1  capsule (100 mg total) by mouth every 8 (eight) hours. Patient not taking: Reported on 08/11/2024 07/23/24   Teresa Shelba JONELLE, NP  cetirizine (ZYRTEC) 10 MG tablet Take 10 mg by mouth as needed.  Patient not taking: Reported on 08/11/2024    [provider]  DULoxetine (CYMBALTA) 60 MG capsule Take 120 mg by mouth daily.    [provider]  EUCRISA 2 % OINT Apply     as directed QD/BID to AA's for eczema Patient not taking: Reported on 08/11/2024 10/28/19   [provider]  fluticasone (FLONASE) 50 MCG/ACT nasal spray SPRAY TWO SPRAYS IN EACH NOSTRIL ONCE DAILY    [provider]  guaiFENesin -codeine  100-10 MG/5ML syrup Take 5 mLs by mouth at bedtime as needed for cough. Patient not taking: Reported on 08/11/2024 07/23/24   Teresa Shelba JONELLE, NP  mometasone  (ELOCON ) 0.1 % cream Apply bid to rash at arms and hands prn Patient not taking: Reported on 08/11/2024 07/02/24   Hester Alm BROCKS, MD  montelukast (SINGULAIR) 10 MG tablet Take 1 tablet by mouth daily. Patient not taking: Reported on  08/11/2024    [provider]  mupirocin  ointment (BACTROBAN ) 2 % Apply inside nose three times daily until resolved. 08/06/24   Claudene Lehmann, MD  potassium gluconate 595 (99 K) MG TABS tablet Take by mouth. Patient not taking: Reported on 08/11/2024    [provider]  predniSONE  (STERAPRED UNI-PAK 21 TAB) 10 MG (21) TBPK tablet Take by mouth daily. Take 6 tabs by mouth daily  for 1 days, then 5 tabs for 1 days, then 4 tabs for 1 days, then 3 tabs for 1 days, 2 tabs for 1 days, then 1 tab by mouth daily for 1 days Patient not taking: Reported on 08/11/2024 07/23/24   Teresa Shelba SAUNDERS, NP  triamterene-hydrochlorothiazide (MAXZIDE-25) 37.5-25 MG per tablet Take 1 tablet by mouth daily.     [provider]  valACYclovir (VALTREX) 1000 MG tablet 1,000 mg as needed.  07/07/14   [provider]  valACYclovir (VALTREX) 1000 MG tablet Take  1 tablet by mouth 2 (two) times daily. Patient not taking: Reported on 08/11/2024 12/26/21   [provider]  zolpidem (AMBIEN) 10 MG tablet Take 10 mg by mouth at bedtime as needed for sleep.    [provider]  Zoster Vaccine Adjuvanted [facility]) injection     [provider]    Family History Family History  Problem Relation Age of Onset   Hypertension Mother    Hypertension Father    Breast cancer Maternal Aunt 3   Colon cancer Paternal Grandmother    Diabetes Paternal Grandfather    Heart disease Neg Hx    Ovarian cancer Neg Hx     Social History Social History[1]   Allergies   Sulfa antibiotics   Review of Systems Review of Systems  Constitutional:  Negative for chills and fever.  Gastrointestinal:  Positive for abdominal pain.  Genitourinary:  Positive for dysuria, frequency and hematuria. Negative for flank pain.     Physical Exam Triage Vital Signs ED Triage Vitals  Encounter Vitals Group     BP 09/04/24 1647 (!) 163/88     Girls Systolic BP Percentile --      Girls Diastolic BP Percentile --      Boys Systolic BP Percentile --      Boys Diastolic BP Percentile --      Pulse Rate 09/04/24 1643 87     Resp 09/04/24 1643 18     Temp 09/04/24 1643 98.2 F (36.8 C)     Temp src --      SpO2 09/04/24 1643 99 %     Weight --      Height --      Head Circumference --      Peak Flow --      Pain Score 09/04/24 1647 7     Pain Loc --      Pain Education --      Exclude from Growth Chart --    No data found.  Updated Vital Signs BP (!) 163/88   Pulse 87   Temp 98.2 F (36.8 C)   Resp 18   LMP 08/24/2016   SpO2 99%   Visual Acuity Right Eye Distance:   Left Eye Distance:   Bilateral Distance:    Right Eye Near:   Left Eye Near:    Bilateral Near:     Physical Exam Constitutional:      General: She is not in acute distress. HENT:     Mouth/Throat:  Mouth: Mucous membranes are moist.  Cardiovascular:      Rate and Rhythm: Normal rate.  Pulmonary:     Effort: Pulmonary effort is normal. No respiratory distress.  Abdominal:     General: Bowel sounds are normal.     Palpations: Abdomen is soft.     Tenderness: There is no abdominal tenderness. There is no right CVA tenderness, left CVA tenderness, guarding or rebound.  Neurological:     Mental Status: She is alert.      UC Treatments / Results  Labs (all labs ordered are listed, but only abnormal results are displayed) Labs Reviewed  POCT URINE DIPSTICK - Abnormal; Notable for the following components:      Result Value   Color, UA red (*)    Bilirubin, UA small (*)    Blood, UA large (*)    pH, UA 8.5 (*)    POC PROTEIN,UA =100 (*)    Nitrite, UA Positive (*)    Leukocytes, UA Small (1+) (*)    All other components within normal limits  URINE CULTURE    EKG   Radiology No results found.  Procedures Procedures (including critical care time)  Medications Ordered in UC Medications  phenazopyridine (PYRIDIUM) tablet 200 mg (200 mg Oral Given 09/04/24 1653)    Initial Impression / Assessment and Plan / UC Course  I have reviewed the triage vital signs and the nursing notes.  Pertinent labs & imaging results that were available during my care of the patient were reviewed by me and considered in my medical decision making (see chart for details).    UTI with hematuria.  Afebrile.  Abdomen is soft and nontender with good bowel sounds; no CVAT.  Treating with Keflex.  Urine culture pending.  Education provided on UTI and hematuria.  Strict ED precautions given.  Instructed patient to follow-up with her PCP on Monday.  She agrees to plan of care.  Final Clinical Impressions(s) / UC Diagnoses   Final diagnoses:  Urinary tract infection with hematuria, site unspecified     Discharge Instructions      Follow up with your primary care provider on Monday.  Go to the emergency department if you have worsening symptoms.     Take the antibiotic as directed.  The urine culture is pending.  We will call you if it shows the need to change or discontinue your antibiotic.         ED Prescriptions     Medication Sig Dispense Auth. Provider   cephALEXin (KEFLEX) 500 MG capsule Take 1 capsule (500 mg total) by mouth 3 (three) times daily for 5 days. 15 capsule Corlis Burnard DEL, NP      PDMP not reviewed this encounter.    [1]  Social History Tobacco Use   Smoking status: Never   Smokeless tobacco: Never  Vaping Use   Vaping status: Never Used  Substance Use Topics   Alcohol use: Yes    Alcohol/week: 1.0 standard drink of alcohol    Types: 1 Glasses of wine per week    Comment: 1 drink a day and more on weekedns   Drug use: No     Corlis Burnard DEL, NP 09/04/24 1719

## 2024-09-04 NOTE — ED Triage Notes (Signed)
 Patient to Urgent Care with complaints of urinary frequency/ dysuria/ bladder pressure/ back pain.  Symptoms started approx 1.5 hours ago.

## 2024-09-04 NOTE — Discharge Instructions (Addendum)
 Follow up with your primary care provider on Monday.  Go to the emergency department if you have worsening symptoms.    Take the antibiotic as directed.  The urine culture is pending.  We will call you if it shows the need to change or discontinue your antibiotic.

## 2024-09-07 ENCOUNTER — Ambulatory Visit (HOSPITAL_COMMUNITY): Payer: Self-pay

## 2024-09-07 LAB — URINE CULTURE: Culture: >=100000 — AB

## 2024-09-28 ENCOUNTER — Ambulatory Visit (INDEPENDENT_AMBULATORY_CARE_PROVIDER_SITE_OTHER): Payer: Self-pay | Admitting: Dermatology

## 2024-09-28 ENCOUNTER — Encounter: Payer: Self-pay | Admitting: Dermatology

## 2024-09-28 ENCOUNTER — Ambulatory Visit: Admitting: Dermatology

## 2024-09-28 DIAGNOSIS — L988 Other specified disorders of the skin and subcutaneous tissue: Secondary | ICD-10-CM

## 2024-09-28 NOTE — Progress Notes (Signed)
" ° °  Follow-Up Visit   Subjective  Abigail Gross is a 64 y.o. female who presents for the following: Botox for facial elastosis  The following portions of the chart were reviewed this encounter and updated as appropriate: medications, allergies, medical history  Review of Systems:  No other skin or systemic complaints except as noted in HPI or Assessment and Plan.  Objective  Well appearing patient in no apparent distress; mood and affect are within normal limits.  A focused examination was performed of the face.  Relevant physical exam findings are noted in the Assessment and Plan.  Injection map photo     Assessment & Plan    Facial Elastosis Botox 6 units injected today to: - Upper lip 4 units - L Lower lip 3 units (increased from 2 units) to increase lip eversion due to asymmetry- if not helpful, consider filler in Left lower lip to fix asymmetry. Smile normal today, not asymmetric  Location: upper lip, L lower lip  Informed consent: Discussed risks (infection, pain, bleeding, bruising, swelling, allergic reaction, paralysis of nearby muscles, eyelid droop, double vision, neck weakness, difficulty breathing, headache, undesirable cosmetic result, and need for additional treatment) and benefits of the procedure, as well as the alternatives.  Informed consent was obtained.   Preparation: The area was cleansed with alcohol.  Procedure Details:  Botox was injected into the dermis with a 30-gauge needle. Pressure applied to any bleeding. Ice packs offered for swelling.  Lot Number:  [id number] Expiration:  08/2026  Total Units Injected:  7  Plan: Tylenol may be used for headache.  Allow 2 weeks before returning to clinic for additional dosing as needed. Patient will call for any problems.  Return for as scheduled for botox.  I, Grayce Saunas, RMA, am acting as scribe for Rexene Rattler, MD .   Documentation: I have reviewed the above documentation for accuracy and  completeness, and I agree with the above.  Rexene Rattler, MD      "

## 2024-09-28 NOTE — Patient Instructions (Signed)

## 2024-10-02 ENCOUNTER — Ambulatory Visit
Admission: EM | Admit: 2024-10-02 | Discharge: 2024-10-02 | Disposition: A | Discharge status: Home / Self Care | Attending: Emergency Medicine | Admitting: Emergency Medicine

## 2024-10-02 DIAGNOSIS — J069 Acute upper respiratory infection, unspecified: Secondary | ICD-10-CM | POA: Diagnosis not present

## 2024-10-02 MED ORDER — PREDNISONE 10 MG (21) PO TBPK: ORAL_TABLET | Freq: Every day | ORAL | 0 refills | Status: AC

## 2024-10-02 NOTE — ED Provider Notes (Signed)
 " [name]    CSN: [id number] Arrival date & time: 10/02/24  1540      History   Chief Complaint Chief Complaint  Patient presents with   Cough   Otalgia   Sore Throat    HPI Abigail Gross is a 64 y.o. female.  Patient presents with 3 to 4-day history of ear pain, congestion, postnasal drainage, sore throat, cough.  No fever, shortness of breath, vomiting, diarrhea.  She took NyQuil last night; no OTC medication today.  Her medical history includes hypertension, bronchospastic airway disease, GERD.  The history is provided by the patient and medical records.    Past Medical History:  Diagnosis Date   Anxiety    Edema    Enlarged uterus    Fibroid    GERD (gastroesophageal reflux disease)    Hypertension    Insomnia    Ovarian cyst    Shingles     Patient Active Problem List   Diagnosis Date Noted   Cervical stenosis (uterine cervix) 08/11/2024   ASCUS with positive high risk HPV cervical [R87.610, R87.810] 08/11/2024   Prediabetes 08/06/2022   Osteoarthritis of right hip 07/18/2022   Bronchospastic airway disease 04/06/2019   GERD (gastroesophageal reflux disease) 04/06/2019   Ganglion cyst of finger of right hand 06/30/2018   ADD (attention deficit disorder) 09/28/2015   Essential hypertension 08/03/2014    Past Surgical History:  Procedure Laterality Date   TUBAL LIGATION      OB History     Gravida  2   Para  2   Term  2   Preterm      AB      Living  2      SAB      IAB      Ectopic      Multiple      Live Births  2            Home Medications    Prior to Admission medications  Medication Sig Start Date End Date Taking? Authorizing Provider  predniSONE  (STERAPRED UNI-PAK 21 TAB) 10 MG (21) TBPK tablet Take by mouth daily. As directed 10/02/24  Yes Corlis Burnard DEL, NP  albuterol  (PROVENTIL  HFA;VENTOLIN  HFA) 108 (90 Base) MCG/ACT inhaler Inhale 2 puffs into the lungs every 6 (six) hours as needed for wheezing or  shortness of breath. 10/10/17   Defrancesco, Gladis LABOR, MD  benzonatate  (TESSALON ) 100 MG capsule Take 1 capsule (100 mg total) by mouth every 8 (eight) hours. Patient not taking: Reported on 08/11/2024 07/23/24   Teresa Shelba JONELLE, NP  cetirizine (ZYRTEC) 10 MG tablet Take 10 mg by mouth as needed.  Patient not taking: Reported on 08/11/2024    [provider]  DULoxetine (CYMBALTA) 60 MG capsule Take 120 mg by mouth daily.    [provider]  EUCRISA 2 % OINT Apply     as directed QD/BID to AA's for eczema Patient not taking: Reported on 08/11/2024 10/28/19   [provider]  fluticasone (FLONASE) 50 MCG/ACT nasal spray SPRAY TWO SPRAYS IN EACH NOSTRIL ONCE DAILY    [provider]  guaiFENesin -codeine  100-10 MG/5ML syrup Take 5 mLs by mouth at bedtime as needed for cough. Patient not taking: Reported on 08/11/2024 07/23/24   Teresa Shelba JONELLE, NP  mometasone  (ELOCON ) 0.1 % cream Apply bid to rash at arms and hands prn Patient not taking: Reported on 08/11/2024 07/02/24   Hester Alm BROCKS, MD  montelukast ENOLA)  10 MG tablet Take 1 tablet by mouth daily. Patient not taking: Reported on 08/11/2024    [provider]  mupirocin  ointment (BACTROBAN ) 2 % Apply inside nose three times daily until resolved. 08/06/24   Claudene Lehmann, MD  potassium gluconate 595 (99 K) MG TABS tablet Take by mouth. Patient not taking: Reported on 08/11/2024    [provider]  triamterene-hydrochlorothiazide (MAXZIDE-25) 37.5-25 MG per tablet Take 1 tablet by mouth daily.     [provider]  valACYclovir (VALTREX) 1000 MG tablet 1,000 mg as needed.  07/07/14   [provider]  valACYclovir (VALTREX) 1000 MG tablet Take 1 tablet by mouth 2 (two) times daily. Patient not taking: Reported on 08/11/2024 12/26/21   [provider]  zolpidem (AMBIEN) 10 MG tablet Take 10 mg by mouth at bedtime as needed for sleep.    [provider]  Zoster Vaccine Adjuvanted [facility]) injection     [provider]    Family History Family History  Problem Relation Age of Onset   Hypertension Mother    Hypertension Father    Breast cancer Maternal Aunt 71   Colon cancer Paternal Grandmother    Diabetes Paternal Grandfather    Heart disease Neg Hx    Ovarian cancer Neg Hx     Social History Social History[1]   Allergies   Sulfa antibiotics   Review of Systems Review of Systems  Constitutional:  Negative for chills and fever.  HENT:  Positive for congestion, ear pain, postnasal drip and sore throat.   Respiratory:  Positive for cough. Negative for shortness of breath.   Gastrointestinal:  Negative for diarrhea and vomiting.     Physical Exam Triage Vital Signs ED Triage Vitals  Encounter Vitals Group     BP 10/02/24 1641 (!) 142/88     Girls Systolic BP Percentile --      Girls Diastolic BP Percentile --      Boys Systolic BP Percentile --      Boys Diastolic BP Percentile --      Pulse Rate 10/02/24 1641 71     Resp 10/02/24 1641 18     Temp 10/02/24 1641 98 F (36.7 C)     Temp src --      SpO2 10/02/24 1641 99 %     Weight --      Height --      Head Circumference --      Peak Flow --      Pain Score 10/02/24 1653 6     Pain Loc --      Pain Education --      Exclude from Growth Chart --    No data found.  Updated Vital Signs BP (!) 142/88   Pulse 71   Temp 98 F (36.7 C)   Resp 18   LMP 08/24/2016   SpO2 99%   Visual Acuity Right Eye Distance:   Left Eye Distance:   Bilateral Distance:    Right Eye Near:   Left Eye Near:    Bilateral Near:     Physical Exam Constitutional:      General: She is not in acute distress. HENT:     Right Ear: Tympanic membrane normal.     Left Ear: Tympanic membrane normal.     Nose: Congestion present.     Mouth/Throat:     Mouth: Mucous membranes are moist.     Pharynx: Oropharynx is clear.  Cardiovascular:  Rate  and Rhythm: Normal rate and regular rhythm.     Heart sounds: Normal heart sounds.  Pulmonary:     Effort: Pulmonary effort is normal. No respiratory distress.     Breath sounds: Normal breath sounds.  Neurological:     Mental Status: She is alert.      UC Treatments / Results  Labs (all labs ordered are listed, but only abnormal results are displayed) Labs Reviewed - No data to display  EKG   Radiology No results found.  Procedures Procedures (including critical care time)  Medications Ordered in UC Medications - No data to display  Initial Impression / Assessment and Plan / UC Course  I have reviewed the triage vital signs and the nursing notes.  Pertinent labs & imaging results that were available during my care of the patient were reviewed by me and considered in my medical decision making (see chart for details).   Viral URI.  Afebrile and vital signs are stable.  Lungs are clear and O2 sat is 99% on room air.  Treating today with prednisone  taper.  Discussed symptomatic treatment and education provided on viral respiratory infection.  Instructed her to follow-up with her PCP.  She agrees to plan of care.   Final Clinical Impressions(s) / UC Diagnoses   Final diagnoses:  Viral URI     Discharge Instructions      Take the prednisone  taper as directed.  Follow up with your primary care provider on Monday..        ED Prescriptions     Medication Sig Dispense Auth. Provider   predniSONE  (STERAPRED UNI-PAK 21 TAB) 10 MG (21) TBPK tablet Take by mouth daily. As directed 21 tablet Corlis Burnard DEL, NP      PDMP not reviewed this encounter.    [1]  Social History Tobacco Use   Smoking status: Never   Smokeless tobacco: Never  Vaping Use   Vaping status: Never Used  Substance Use Topics   Alcohol use: Yes    Alcohol/week: 1.0 standard drink of alcohol    Types: 1 Glasses of wine per week    Comment: 1 drink a day and more on weekedns   Drug use: No      Corlis Burnard DEL, NP 10/02/24 1720  "

## 2024-10-02 NOTE — ED Triage Notes (Signed)
 Patient to Urgent Care with complaints of  bilateral ear pain/ productive cough (green mucus)/ sore throat. Unsure of any fevers.  Symptoms started Tuesday.   Using nyquil.

## 2024-10-02 NOTE — Discharge Instructions (Addendum)
 Take the prednisone  taper as directed.  Follow up with your primary care provider on Monday.SABRA

## 2024-10-05 ENCOUNTER — Encounter: Payer: Self-pay | Admitting: Emergency Medicine

## 2024-10-05 ENCOUNTER — Ambulatory Visit
Admission: EM | Admit: 2024-10-05 | Discharge: 2024-10-05 | Disposition: A | Discharge status: Home / Self Care | Attending: Family Medicine | Admitting: Family Medicine

## 2024-10-05 DIAGNOSIS — J329 Chronic sinusitis, unspecified: Secondary | ICD-10-CM | POA: Diagnosis not present

## 2024-10-05 DIAGNOSIS — J9809 Other diseases of bronchus, not elsewhere classified: Secondary | ICD-10-CM | POA: Diagnosis not present

## 2024-10-05 DIAGNOSIS — B9689 Other specified bacterial agents as the cause of diseases classified elsewhere: Secondary | ICD-10-CM

## 2024-10-05 MED ORDER — ALBUTEROL SULFATE HFA 108 (90 BASE) MCG/ACT IN AERS: 1.0000 | INHALATION_SPRAY | Freq: Four times a day (QID) | RESPIRATORY_TRACT | 0 refills | Status: AC | PRN

## 2024-10-05 MED ORDER — AMOXICILLIN-POT CLAVULANATE 875-125 MG PO TABS: 1.0000 | ORAL_TABLET | Freq: Two times a day (BID) | ORAL | 0 refills | Status: AC

## 2024-10-05 NOTE — ED Triage Notes (Signed)
 Patient complains of cough, bilateral ear pain, nasal congestion with green mucus and hoarseness x 6 days. Patient was seen here on 10/02/24 and was placed on prednisone . Patient reports increase mucus production. Patient has tried mucinex  and old prescription of guaifenesin  -codeine  syrup with no relief.  Patient states I have cough so much to it is making my throat hurt.

## 2024-10-05 NOTE — ED Provider Notes (Addendum)
 " [name]    CSN: [id number] Arrival date & time: 10/05/24  0816      History   Chief Complaint Chief Complaint  Patient presents with   Hoarse   Cough   Otalgia   Nasal Congestion    HPI Abigail Gross is a 64 y.o. female  presents for evaluation of URI symptoms for 6-7 days. Patient reports associated symptoms of sinus pressure/pain with purulent nasal discharge, postnasal drip causing cough, laryngitis, fatigue, sore throat. Denies N/V/D, ear pain, body aches, shortness of breath. Patient does have a hx of asthma but states she typically needs an inhaler when she gets sick. Patient is not an active smoker.   Reports no known sick contacts.  Was seen in urgent care on 1/9 for same symptoms.  Given prednisone  which she has been taking.  Pt has taken Mucinex  OTC for symptoms. Pt has no other concerns at this time.    Cough Associated symptoms: sore throat   Otalgia Associated symptoms: congestion, cough and sore throat     Past Medical History:  Diagnosis Date   Anxiety    Edema    Enlarged uterus    Fibroid    GERD (gastroesophageal reflux disease)    Hypertension    Insomnia    Ovarian cyst    Shingles     Patient Active Problem List   Diagnosis Date Noted   Cervical stenosis (uterine cervix) 08/11/2024   ASCUS with positive high risk HPV cervical [R87.610, R87.810] 08/11/2024   Prediabetes 08/06/2022   Osteoarthritis of right hip 07/18/2022   Bronchospastic airway disease 04/06/2019   GERD (gastroesophageal reflux disease) 04/06/2019   Ganglion cyst of finger of right hand 06/30/2018   ADD (attention deficit disorder) 09/28/2015   Essential hypertension 08/03/2014    Past Surgical History:  Procedure Laterality Date   TUBAL LIGATION      OB History     Gravida  2   Para  2   Term  2   Preterm      AB      Living  2      SAB      IAB      Ectopic      Multiple      Live Births  2            Home Medications     Prior to Admission medications  Medication Sig Start Date End Date Taking? Authorizing Provider  albuterol  (VENTOLIN  HFA) 108 (90 Base) MCG/ACT inhaler Inhale 1-2 puffs into the lungs every 6 (six) hours as needed for wheezing or shortness of breath. 10/05/24  Yes Ankit Degregorio, Jodi R, NP  amoxicillin -clavulanate (AUGMENTIN ) 875-125 MG tablet Take 1 tablet by mouth every 12 (twelve) hours. 10/05/24  Yes Loreda Myla JONELLE, NP  benzonatate  (TESSALON ) 100 MG capsule Take 1 capsule (100 mg total) by mouth every 8 (eight) hours. Patient not taking: Reported on 08/11/2024 07/23/24   Teresa Shelba JONELLE, NP  cetirizine (ZYRTEC) 10 MG tablet Take 10 mg by mouth as needed.  Patient not taking: Reported on 08/11/2024    [provider]  DULoxetine (CYMBALTA) 60 MG capsule Take 120 mg by mouth daily.    [provider]  EUCRISA 2 % OINT Apply     as directed QD/BID to AA's for eczema Patient not taking: Reported on 08/11/2024 10/28/19   [provider]  fluticasone (FLONASE) 50 MCG/ACT nasal spray SPRAY TWO SPRAYS IN EACH NOSTRIL ONCE  DAILY    [provider]  guaiFENesin -codeine  100-10 MG/5ML syrup Take 5 mLs by mouth at bedtime as needed for cough. Patient not taking: Reported on 08/11/2024 07/23/24   Teresa Shelba SAUNDERS, NP  mometasone  (ELOCON ) 0.1 % cream Apply bid to rash at arms and hands prn Patient not taking: Reported on 08/11/2024 07/02/24   Hester Alm BROCKS, MD  montelukast (SINGULAIR) 10 MG tablet Take 1 tablet by mouth daily. Patient not taking: Reported on 08/11/2024    [provider]  mupirocin  ointment (BACTROBAN ) 2 % Apply inside nose three times daily until resolved. 08/06/24   Claudene Lehmann, MD  potassium gluconate 595 (99 K) MG TABS tablet Take by mouth. Patient not taking: Reported on 08/11/2024    [provider]  predniSONE  (STERAPRED UNI-PAK 21 TAB) 10 MG (21) TBPK tablet Take by mouth daily. As directed 10/02/24   Corlis Burnard DEL, NP   triamterene-hydrochlorothiazide (MAXZIDE-25) 37.5-25 MG per tablet Take 1 tablet by mouth daily.     [provider]  valACYclovir (VALTREX) 1000 MG tablet 1,000 mg as needed.  07/07/14   [provider]  valACYclovir (VALTREX) 1000 MG tablet Take 1 tablet by mouth 2 (two) times daily. Patient not taking: Reported on 08/11/2024 12/26/21   [provider]  zolpidem (AMBIEN) 10 MG tablet Take 10 mg by mouth at bedtime as needed for sleep.    [provider]  Zoster Vaccine Adjuvanted [facility]) injection     [provider]    Family History Family History  Problem Relation Age of Onset   Hypertension Mother    Hypertension Father    Breast cancer Maternal Aunt 60   Colon cancer Paternal Grandmother    Diabetes Paternal Grandfather    Heart disease Neg Hx    Ovarian cancer Neg Hx     Social History Social History[1]   Allergies   Sulfa antibiotics   Review of Systems Review of Systems  Constitutional:  Positive for fatigue.  HENT:  Positive for congestion, sinus pressure, sinus pain and sore throat.   Respiratory:  Positive for cough.      Physical Exam Triage Vital Signs ED Triage Vitals  Encounter Vitals Group     BP 10/05/24 0945 130/83     Girls Systolic BP Percentile --      Girls Diastolic BP Percentile --      Boys Systolic BP Percentile --      Boys Diastolic BP Percentile --      Pulse Rate 10/05/24 0945 68     Resp 10/05/24 0945 18     Temp 10/05/24 0945 97.9 F (36.6 C)     Temp Source 10/05/24 0945 Oral     SpO2 10/05/24 0938 97 %     Weight --      Height --      Head Circumference --      Peak Flow --      Pain Score 10/05/24 0943 3     Pain Loc --      Pain Education --      Exclude from Growth Chart --    No data found.  Updated Vital Signs BP 130/83 (BP Location: Right Arm)   Pulse 68   Temp 97.9 F (36.6 C) (Oral)   Resp 18   LMP 08/24/2016   SpO2 97%   Visual Acuity Right Eye  Distance:   Left Eye Distance:   Bilateral Distance:    Right Eye Near:  Left Eye Near:    Bilateral Near:     Physical Exam Vitals and nursing note reviewed.  Constitutional:      General: She is not in acute distress.    Appearance: She is well-developed. She is not ill-appearing.  HENT:     Head: Normocephalic and atraumatic.     Right Ear: Tympanic membrane and ear canal normal.     Left Ear: Tympanic membrane and ear canal normal.     Nose: Congestion present.     Right Turbinates: Swollen and pale.     Left Turbinates: Swollen and pale.     Right Sinus: Maxillary sinus tenderness present. No frontal sinus tenderness.     Left Sinus: Maxillary sinus tenderness present. No frontal sinus tenderness.     Mouth/Throat:     Mouth: Mucous membranes are moist.     Pharynx: Oropharynx is clear. Uvula midline. No oropharyngeal exudate or posterior oropharyngeal erythema.     Tonsils: No tonsillar exudate or tonsillar abscesses.  Eyes:     Conjunctiva/sclera: Conjunctivae normal.     Pupils: Pupils are equal, round, and reactive to light.  Cardiovascular:     Rate and Rhythm: Normal rate and regular rhythm.     Heart sounds: Normal heart sounds.  Pulmonary:     Effort: Pulmonary effort is normal.     Breath sounds: Normal breath sounds. No wheezing, rhonchi or rales.  Musculoskeletal:     Cervical back: Normal range of motion and neck supple.  Lymphadenopathy:     Cervical: No cervical adenopathy.  Skin:    General: Skin is warm and dry.  Neurological:     General: No focal deficit present.     Mental Status: She is alert and oriented to person, place, and time.  Psychiatric:        Mood and Affect: Mood normal.        Behavior: Behavior normal.      UC Treatments / Results  Labs (all labs ordered are listed, but only abnormal results are displayed) Labs Reviewed - No data to display Comprehensive Metabolic Panel (CMP) Order: [id number] Component Ref Range & Units  2 mo ago  Glucose 70 - 110 mg/dL 98  Sodium 863 - 854 mmol/L 141  Potassium 3.6 - 5.1 mmol/L 3.9  Chloride 97 - 109 mmol/L 103  Carbon Dioxide (CO2) 22.0 - 32.0 mmol/L 31.8  Urea Nitrogen (BUN) 7 - 25 mg/dL 28 High   Creatinine 0.6 - 1.1 mg/dL 0.8  Glomerular Filtration Rate (eGFR) >60 mL/min/1.73sq m 83  Comment: CKD-EPI (2021) does not include patient's race in the calculation of eGFR.  Monitoring changes of plasma creatinine and eGFR over time is useful for monitoring kidney function.  Interpretive Ranges for eGFR (CKD-EPI 2021):  eGFR:       >60 mL/min/1.73 sq. m - Normal eGFR:       30-59 mL/min/1.73 sq. m - Moderately Decreased eGFR:       15-29 mL/min/1.73 sq. m  - Severely Decreased eGFR:       < 15 mL/min/1.73 sq. m  - Kidney Failure   Note: These eGFR calculations do not apply in acute situations when eGFR is changing rapidly or patients on dialysis.  Calcium 8.7 - 10.3 mg/dL 89.1 High   AST 8 - 39 U/L 23  ALT 5 - 38 U/L 21  Alk Phos (alkaline Phosphatase) 34 - 104 U/L 74  Albumin 3.5 - 4.8 g/dL 4.4  Bilirubin, Total 0.3 -  1.2 mg/dL 0.3  Protein, Total 6.1 - 7.9 g/dL 6.6  A/G Ratio 1.0 - 5.0 gm/dL 2.0  Resulting Agency [facility] - LAB   Specimen Collected: 07/24/24 09:57   Performed by: [facility] - LAB Last Resulted: 07/24/24 17:20  Received From: [facility]  Result Received: 07/29/24 07:35   EKG   Radiology No results found.  Procedures Procedures (including critical care time)  Medications Ordered in UC Medications - No data to display  Initial Impression / Assessment and Plan / UC Course  I have reviewed the triage vital signs and the nursing notes.  Pertinent labs & imaging results that were available during my care of the patient were reviewed by me and considered in my medical decision making (see chart for details).     Will start Augmentin  for sinusitis.  Continue Flonase daily.  Also  refilled her albuterol  inhaler to use as needed.  Encourage rest fluids and PCP follow-up in 2 to 3 days for recheck.  ER precautions reviewed Final Clinical Impressions(s) / UC Diagnoses   Final diagnoses:  Bacterial sinusitis  Bronchospastic airway disease     Discharge Instructions      Start Augmentin  antibiotic twice daily for 7 days.  Continue Flonase and previously prescribed cough medicine as needed.  I have refilled your albuterol  inhaler to use as needed for wheezing or shortness of breath.  Lots of rest and fluids.  Follow-up with your PCP in 2 to 3 days for recheck.  Please go to the ER for any worsening symptoms.  Hope you feel better soon!    ED Prescriptions     Medication Sig Dispense Auth. Provider   amoxicillin -clavulanate (AUGMENTIN ) 875-125 MG tablet Take 1 tablet by mouth every 12 (twelve) hours. 14 tablet Travez Stancil, Jodi R, NP   albuterol  (VENTOLIN  HFA) 108 (90 Base) MCG/ACT inhaler Inhale 1-2 puffs into the lungs every 6 (six) hours as needed for wheezing or shortness of breath. 1 each Loreda Myla SAUNDERS, NP      PDMP not reviewed this encounter.    Loreda Myla SAUNDERS, NP 10/05/24 1008     [1]  Social History Tobacco Use   Smoking status: Never   Smokeless tobacco: Never  Vaping Use   Vaping status: Never Used  Substance Use Topics   Alcohol use: Yes    Alcohol/week: 1.0 standard drink of alcohol    Types: 1 Glasses of wine per week    Comment: 1 drink a day and more on weekedns   Drug use: No     Loreda Myla SAUNDERS, NP 10/05/24 1008  "

## 2024-10-05 NOTE — Discharge Instructions (Addendum)
 Start Augmentin  antibiotic twice daily for 7 days.  Continue Flonase and previously prescribed cough medicine as needed.  I have refilled your albuterol  inhaler to use as needed for wheezing or shortness of breath.  Lots of rest and fluids.  Follow-up with your PCP in 2 to 3 days for recheck.  Please go to the ER for any worsening symptoms.  Hope you feel better soon!

## 2024-10-26 ENCOUNTER — Ambulatory Visit: Admitting: Dermatology

## 2024-10-28 ENCOUNTER — Ambulatory Visit: Payer: Self-pay | Admitting: Dermatology

## 2024-10-28 DIAGNOSIS — L988 Other specified disorders of the skin and subcutaneous tissue: Secondary | ICD-10-CM

## 2024-10-28 NOTE — Progress Notes (Signed)
" ° °  Follow-Up Visit   Subjective  Abigail Gross is a 64 y.o. female who presents for the following: Botox for facial elastosis  The following portions of the chart were reviewed this encounter and updated as appropriate: medications, allergies, medical history  Review of Systems:  No other skin or systemic complaints except as noted in HPI or Assessment and Plan.  Objective  Well appearing patient in no apparent distress; mood and affect are within normal limits.  A focused examination was performed of the face.  Relevant physical exam findings are noted in the Assessment and Plan.  Injection map photo     Assessment & Plan   ELASTOSIS OF SKIN   Facial Elastosis  Botox - Total 70 units - Frown complex - 20 units - Brow lift - 2.5 units x 2 - Botox comma - 1 unit x 2 - Forehead - 6 units - Crow's feet - 10 units x 2 - Bunny lines - 1 unit x 2 - Upper lip - 4 units - Lower lip - 3 units - DAOs - 2 units x 2, superficial injection - Chin - 4 units  Location: See attached image  Informed consent: Discussed risks (infection, pain, bleeding, bruising, swelling, allergic reaction, paralysis of nearby muscles, eyelid droop, double vision, neck weakness, difficulty breathing, headache, undesirable cosmetic result, and need for additional treatment) and benefits of the procedure, as well as the alternatives.  Informed consent was obtained.  Preparation: The area was cleansed with alcohol.  Procedure Details:  Botox was injected into the dermis with a 30-gauge needle. Pressure applied to any bleeding. Ice packs offered for swelling.  Lot Number:  [id number] Expiration:  08/2026  Total Units Injected:  70  Plan: Tylenol may be used for headache.  Allow 2 weeks before returning to clinic for additional dosing as needed. Patient will call for any problems.  Return 2 mo lip flip, 3 mo Botox and lip flip.  IAndrea Kerns, CMA, am acting as scribe for Rexene Rattler, MD  .   Documentation: I have reviewed the above documentation for accuracy and completeness, and I agree with the above.  Rexene Rattler, MD      "

## 2024-10-28 NOTE — Patient Instructions (Signed)

## 2024-11-23 ENCOUNTER — Ambulatory Visit: Admitting: Dermatology

## 2024-12-29 ENCOUNTER — Ambulatory Visit: Admitting: Dermatology

## 2025-01-26 ENCOUNTER — Ambulatory Visit: Admitting: Dermatology

## 2025-07-29 ENCOUNTER — Ambulatory Visit: Admitting: Dermatology
